# Patient Record
Sex: Female | Born: 1975 | Race: White | Hispanic: No | Marital: Married | Smoking: Former smoker
Health system: Southern US, Community
[De-identification: ages and names within clinical notes are randomized; demographics above are authoritative.]

## PROBLEM LIST (undated history)

## (undated) DIAGNOSIS — T7840XA Allergy, unspecified, initial encounter: Secondary | ICD-10-CM

## (undated) HISTORY — DX: Allergy, unspecified, initial encounter: T78.40XA

---

## 1998-07-02 ENCOUNTER — Other Ambulatory Visit: Admission: RE | Admit: 1998-07-02 | Discharge: 1998-07-02 | Payer: Self-pay | Admitting: Family Medicine

## 2004-03-20 ENCOUNTER — Other Ambulatory Visit: Admission: RE | Admit: 2004-03-20 | Discharge: 2004-03-20 | Payer: Self-pay | Admitting: Family Medicine

## 2005-03-31 ENCOUNTER — Other Ambulatory Visit: Admission: RE | Admit: 2005-03-31 | Discharge: 2005-03-31 | Payer: Self-pay | Admitting: Family Medicine

## 2006-05-24 ENCOUNTER — Encounter: Admission: RE | Admit: 2006-05-24 | Discharge: 2006-05-24 | Payer: Self-pay | Admitting: Obstetrics and Gynecology

## 2006-06-04 ENCOUNTER — Inpatient Hospital Stay (HOSPITAL_COMMUNITY): Admission: AD | Admit: 2006-06-04 | Discharge: 2006-06-07 | Payer: Self-pay | Admitting: Obstetrics and Gynecology

## 2006-07-19 ENCOUNTER — Other Ambulatory Visit: Admission: RE | Admit: 2006-07-19 | Discharge: 2006-07-19 | Payer: Self-pay | Admitting: Obstetrics and Gynecology

## 2007-04-05 ENCOUNTER — Ambulatory Visit (HOSPITAL_COMMUNITY): Admission: RE | Admit: 2007-04-05 | Discharge: 2007-04-05 | Payer: Self-pay | Admitting: Family Medicine

## 2015-06-07 ENCOUNTER — Other Ambulatory Visit (HOSPITAL_COMMUNITY): Payer: Self-pay | Admitting: Physician Assistant

## 2015-06-07 DIAGNOSIS — M5410 Radiculopathy, site unspecified: Secondary | ICD-10-CM

## 2015-06-07 DIAGNOSIS — M5126 Other intervertebral disc displacement, lumbar region: Secondary | ICD-10-CM

## 2015-06-07 DIAGNOSIS — M5136 Other intervertebral disc degeneration, lumbar region: Secondary | ICD-10-CM

## 2015-06-20 ENCOUNTER — Ambulatory Visit (HOSPITAL_COMMUNITY)
Admission: RE | Admit: 2015-06-20 | Discharge: 2015-06-20 | Disposition: A | Discharge status: Home / Self Care | Payer: No Typology Code available for payment source | Source: Ambulatory Visit | Attending: Physician Assistant | Admitting: Physician Assistant

## 2015-06-20 ENCOUNTER — Other Ambulatory Visit (HOSPITAL_COMMUNITY): Payer: Self-pay

## 2015-06-20 DIAGNOSIS — M5410 Radiculopathy, site unspecified: Secondary | ICD-10-CM

## 2015-06-20 DIAGNOSIS — M4806 Spinal stenosis, lumbar region: Secondary | ICD-10-CM | POA: Insufficient documentation

## 2015-06-20 DIAGNOSIS — M5126 Other intervertebral disc displacement, lumbar region: Secondary | ICD-10-CM | POA: Diagnosis not present

## 2015-06-20 DIAGNOSIS — M5136 Other intervertebral disc degeneration, lumbar region: Secondary | ICD-10-CM

## 2015-06-20 DIAGNOSIS — M545 Low back pain: Secondary | ICD-10-CM | POA: Diagnosis present

## 2019-05-31 HISTORY — PX: BACK SURGERY: SHX140

## 2020-01-16 ENCOUNTER — Other Ambulatory Visit: Payer: Self-pay

## 2020-01-16 ENCOUNTER — Encounter (HOSPITAL_COMMUNITY): Payer: Self-pay | Admitting: Hematology

## 2020-01-16 ENCOUNTER — Encounter (HOSPITAL_COMMUNITY): Payer: Self-pay | Admitting: *Deleted

## 2020-01-16 ENCOUNTER — Inpatient Hospital Stay (HOSPITAL_COMMUNITY): Payer: Commercial Managed Care - PPO | Attending: Hematology | Admitting: Hematology

## 2020-01-16 ENCOUNTER — Inpatient Hospital Stay (HOSPITAL_COMMUNITY): Payer: Commercial Managed Care - PPO

## 2020-01-16 VITALS — BP 130/65 | HR 87 | Temp 97.1°F | Resp 18 | Ht 62.0 in | Wt 241.0 lb

## 2020-01-16 DIAGNOSIS — D72829 Elevated white blood cell count, unspecified: Secondary | ICD-10-CM | POA: Insufficient documentation

## 2020-01-16 DIAGNOSIS — Z8616 Personal history of COVID-19: Secondary | ICD-10-CM | POA: Insufficient documentation

## 2020-01-16 DIAGNOSIS — Z87891 Personal history of nicotine dependence: Secondary | ICD-10-CM | POA: Diagnosis not present

## 2020-01-16 DIAGNOSIS — Z803 Family history of malignant neoplasm of breast: Secondary | ICD-10-CM

## 2020-01-16 LAB — CBC WITH DIFFERENTIAL/PLATELET
Abs Immature Granulocytes: 0.07 10*3/uL (ref 0.00–0.07)
Basophils Absolute: 0.1 10*3/uL (ref 0.0–0.1)
Basophils Relative: 1 %
Eosinophils Absolute: 0.1 10*3/uL (ref 0.0–0.5)
Eosinophils Relative: 1 %
HCT: 38.3 % (ref 36.0–46.0)
Hemoglobin: 12.1 g/dL (ref 12.0–15.0)
Immature Granulocytes: 1 %
Lymphocytes Relative: 19 %
Lymphs Abs: 2.8 10*3/uL (ref 0.7–4.0)
MCH: 26.9 pg (ref 26.0–34.0)
MCHC: 31.6 g/dL (ref 30.0–36.0)
MCV: 85.1 fL (ref 80.0–100.0)
Monocytes Absolute: 1 10*3/uL (ref 0.1–1.0)
Monocytes Relative: 7 %
Neutro Abs: 10.3 10*3/uL — ABNORMAL HIGH (ref 1.7–7.7)
Neutrophils Relative %: 71 %
Platelets: 336 10*3/uL (ref 150–400)
RBC: 4.5 MIL/uL (ref 3.87–5.11)
RDW: 15.9 % — ABNORMAL HIGH (ref 11.5–15.5)
WBC: 14.3 10*3/uL — ABNORMAL HIGH (ref 4.0–10.5)
nRBC: 0 % (ref 0.0–0.2)

## 2020-01-16 LAB — C-REACTIVE PROTEIN: CRP: 1.8 mg/dL — ABNORMAL HIGH (ref <1.0)

## 2020-01-16 LAB — SEDIMENTATION RATE: Sed Rate: 18 mm/hr (ref 0–22)

## 2020-01-16 LAB — LACTATE DEHYDROGENASE: LDH: 121 U/L (ref 98–192)

## 2020-01-16 NOTE — Assessment & Plan Note (Signed)
1.  Leukocytosis: -CBC on 12/29/2019 showed white count 14.9.  Differential showed increase in absolute neutrophil count and mild increase in absolute leukocyte and monocyte count. -She denied any history of infection or systemic steroids prior to that visit. -She also recollects that she was known to have elevated white count few times in the last couple of years despite having no infection.  Denies any fevers, night sweats or weight loss in the last 6 months. -Denies any personal history of connective tissue disorders.  Denies any history of splenectomy. -Reports that she was diagnosed with a minor tooth infection today by her dentist. -She reports diagnosis of Covid in November 2020 with symptoms similar to sinus infection.  She did not require hospitalization. -No family history of leukemias or connective tissue disorders. -We have reviewed the differential diagnosis of elevated white count.  We will repeat her CBC and review smear.  We will check ANA, rheumatoid factor, ESR/CRP to evaluate for connective tissue disorders.  We will also check JAK2 V617F and BCR/ABL by FISH to evaluate for myeloproliferative disorders.  We will send a flow cytometry to evaluate for lymphoproliferative disorders. -We will schedule her a telephone visit to discuss results and further work-up.

## 2020-01-16 NOTE — Patient Instructions (Addendum)
[facility] at [facility] Discharge Instructions  You were seen today by Dr. Ellin Saba. He went over your history, family history and how you've been feeling lately. He will have blood drawn today before you leave. He will follow up with you by phone visit in 3 weeks.   Thank you for choosing [facility] at [facility] to provide your oncology and hematology care.  To afford each patient quality time with our provider, please arrive at least 15 minutes before your scheduled appointment time.   If you have a lab appointment with the Cancer Center please come in thru the  Main Entrance and check in at the main information desk  You need to re-schedule your appointment should you arrive 10 or more minutes late.  We strive to give you quality time with our providers, and arriving late affects you and other patients whose appointments are after yours.  Also, if you no show three or more times for appointments you may be dismissed from the clinic at the providers discretion.     Again, thank you for choosing [facility].  Our hope is that these requests will decrease the amount of time that you wait before being seen by our physicians.       _____________________________________________________________  Should you have questions after your visit to [facility], please contact our office at [phone] between the hours of 8:00 a.m. and 4:30 p.m.  Voicemails left after 4:00 p.m. will not be returned until the following business day.  For prescription refill requests, have your pharmacy contact our office and allow 72 hours.    Cancer Center Support Programs:   > Cancer Support Group  2nd Tuesday of the month 1pm-2pm, Journey Room

## 2020-01-16 NOTE — Progress Notes (Signed)
CONSULT NOTE  Patient Care Team: Pllc, [place] as PCP - General (Family Medicine)  CHIEF COMPLAINTS/PURPOSE OF CONSULTATION:  Leukocytosis.  HISTORY OF PRESENTING ILLNESS:  Sarah Huff 44 y.o. female is seen for evaluation and work-up for leukocytosis at the request of Pablo Lawrence, FNP.  Recent CBC on 12/29/2019 showed white count of 14.9.  Hemoglobin was 12.9 and hematocrit 37.8.  Platelet count was 342.  Differential showed elevated absolute neutrophil count of 10,500 and absolute lymphocyte count mildly elevated at 3200.  Monocytes were also slightly elevated at 1000.  Eosinophils and basophils were normal.  Last systemic steroid injection was in February 2020.  She had to undergo back surgery in July 2020.  Reports no prior personal history of connective tissue disorders.  Reports decrease in the grip strength of both hands in the past few weeks.  She was reportedly started on B12 injections as her levels were low.  She was told to have a minor tooth infection by her dentist today.  Denies any fevers, night sweats or weight loss in the last 6 months.  Denies any prior history of splenectomy.  Reportedly diagnosed with COVID-19 infection in November 2020 when she had symptoms of sinus infection.  She did not require any hospitalization.  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  She was an ex-smoker, quit 14 years ago.  No family history of connective tissue disorders or leukemias.  Maternal grandmother had breast cancer.  Appetite is 100%.  Energy levels are 50%.  MEDICAL HISTORY:  Past Medical History:  Diagnosis Date  . Allergy     SURGICAL HISTORY: Past Surgical History:  Procedure Laterality Date  . BACK SURGERY  05/2019  . CESAREAN SECTION  05/2006  . TUBAL LIGATION  2007  . uterine ablation  2008  . VAGINAL DELIVERY  1995    SOCIAL HISTORY: Social History   Socioeconomic History  . Marital status: Married    Spouse name:  Montine Circle  . Number of children: 2  . Years of education: Not on file  . Highest education level: Not on file  Occupational History  . Not on file  Tobacco Use  . Smoking status: Former Smoker    Packs/day: 1.50    Years: 14.00    Pack years: 21.00    Types: Cigarettes    Quit date: 01/15/2006    Years since quitting: 14.0  . Smokeless tobacco: Never Used  Substance and Sexual Activity  . Alcohol use: Never  . Drug use: Never  . Sexual activity: Never  Other Topics Concern  . Not on file  Social History Narrative  . Not on file   Social Determinants of Health   Financial Resource Strain:   . Difficulty of Paying Living Expenses: Not on file  Food Insecurity:   . Worried About Charity fundraiser in the Last Year: Not on file  . Ran Out of Food in the Last Year: Not on file  Transportation Needs:   . Lack of Transportation (Medical): Not on file  . Lack of Transportation (Non-Medical): Not on file  Physical Activity:   . Days of Exercise per Week: Not on file  . Minutes of Exercise per Session: Not on file  Stress:   . Feeling of Stress : Not on file  Social Connections:   . Frequency of Communication with Friends and Family: Not on file  . Frequency of Social Gatherings with Friends and Family: Not  on file  . Attends Religious Services: Not on file  . Active Member of Clubs or Organizations: Not on file  . Attends Archivist Meetings: Not on file  . Marital Status: Not on file  Intimate Partner Violence:   . Fear of Current or Ex-Partner: Not on file  . Emotionally Abused: Not on file  . Physically Abused: Not on file  . Sexually Abused: Not on file    FAMILY HISTORY: Family History  Problem Relation Age of Onset  . Stroke Father   . Diabetes Father     ALLERGIES:  has no allergies on file.  MEDICATIONS:  Current Outpatient Medications  Medication Sig Dispense Refill  . cyanocobalamin (,VITAMIN B-12,) 1000 MCG/ML injection Inject 1,000 mcg into  the muscle every 30 (thirty) days.    . Cyanocobalamin (B-12) 1000 MCG CAPS Take 1,000 mcg by mouth daily.     . D3-50 1.25 MG (50000 UT) capsule Take 50,000 Units by mouth once a week.    . Multiple Vitamins-Minerals (ONE-A-DAY WOMENS) tablet Take 1 tablet by mouth daily.    . Omega-3 Fatty Acids (FISH OIL) 1000 MG CAPS Take 1,000 mg by mouth daily.    . Vitamin D, Cholecalciferol, 25 MCG (1000 UT) TABS Take 1,000 Units by mouth daily.     . ciprofloxacin (CIPRO) 500 MG tablet Take 500 mg by mouth 2 (two) times daily.      No current facility-administered medications for this visit.    REVIEW OF SYSTEMS:   Constitutional: Denies fevers, chills or abnormal night sweats.  Positive for mild fatigue. Eyes: Denies blurriness of vision, double vision or watery eyes Ears, nose, mouth, throat, and face: Denies mucositis or sore throat Respiratory: Positive for dyspnea on exertion.  Denies any cough or wheezing. Cardiovascular: Denies palpitation, chest discomfort or lower extremity swelling Gastrointestinal:  Denies nausea, heartburn or change in bowel habits Skin: Denies abnormal skin rashes Lymphatics: Denies new lymphadenopathy or easy bruising Neurological: Numbness in the left ptosis present.  No numbness in the hands.  Behavioral/Psych: Mood is stable, no new changes  All other systems were reviewed with the patient and are negative.  PHYSICAL EXAMINATION: ECOG PERFORMANCE STATUS: 0 - Asymptomatic  Vitals:   01/16/20 1307  BP: 130/65  Pulse: 87  Resp: 18  Temp: (!) 97.1 F (36.2 C)  SpO2: 98%   Filed Weights   01/16/20 1307  Weight: 241 lb (109.3 kg)    GENERAL:alert, no distress and comfortable SKIN: skin color, texture, turgor are normal, no rashes or significant lesions EYES: normal, conjunctiva are pink and non-injected, sclera clear OROPHARYNX:no exudate, no erythema and lips, buccal mucosa, and tongue normal  NECK: supple, thyroid normal size, non-tender, without  nodularity LYMPH:  no palpable lymphadenopathy in the cervical, axillary or inguinal LUNGS: clear to auscultation and percussion with normal breathing effort HEART: regular rate & rhythm and no murmurs and no lower extremity edema ABDOMEN:abdomen soft, non-tender and normal bowel sounds Musculoskeletal:no cyanosis of digits and no clubbing  PSYCH: alert & oriented x 3 with fluent speech NEURO: no focal motor/sensory deficits  LABORATORY DATA:  I have reviewed the data as listed No results found for this or any previous visit (from the past 2160 hour(s)).  RADIOGRAPHIC STUDIES: I have personally reviewed the radiological images as listed and agreed with the findings in the report.  MRI on 06/20/2015 shows L4-L5 disc protrusion.  ASSESSMENT & PLAN:  Leukocytosis 1.  Leukocytosis: -CBC on 12/29/2019 showed white count  14.9.  Differential showed increase in absolute neutrophil count and mild increase in absolute leukocyte and monocyte count. -She denied any history of infection or systemic steroids prior to that visit. -She also recollects that she was known to have elevated white count few times in the last couple of years despite having no infection.  Denies any fevers, night sweats or weight loss in the last 6 months. -Denies any personal history of connective tissue disorders.  Denies any history of splenectomy. -Reports that she was diagnosed with a minor tooth infection today by her dentist. -She reports diagnosis of Covid in November 2020 with symptoms similar to sinus infection.  She did not require hospitalization. -No family history of leukemias or connective tissue disorders. -We have reviewed the differential diagnosis of elevated white count.  We will repeat her CBC and review smear.  We will check ANA, rheumatoid factor, ESR/CRP to evaluate for connective tissue disorders.  We will also check JAK2 V617F and BCR/ABL by FISH to evaluate for myeloproliferative disorders.  We will send  a flow cytometry to evaluate for lymphoproliferative disorders. -We will schedule her a telephone visit to discuss results and further work-up.     All questions were answered. The patient knows to call the clinic with any problems, questions or concerns.      Derek Jack, MD 01/16/20 1:41 PM

## 2020-01-17 LAB — PATHOLOGIST SMEAR REVIEW

## 2020-01-17 LAB — RHEUMATOID FACTOR: Rhuematoid fact SerPl-aCnc: 13.9 IU/mL (ref 0.0–13.9)

## 2020-01-17 LAB — ANTINUCLEAR ANTIBODIES, IFA: ANA Ab, IFA: NEGATIVE

## 2020-01-17 LAB — SURGICAL PATHOLOGY

## 2020-01-24 LAB — BCR-ABL1 FISH
Cells Analyzed: 200
Cells Counted: 200

## 2020-01-26 LAB — CALR + JAK2 E12-15 + MPL (REFLEXED)

## 2020-01-26 LAB — JAK2 V617F, W REFLEX TO CALR/E12/MPL

## 2020-02-06 ENCOUNTER — Other Ambulatory Visit: Payer: Self-pay

## 2020-02-06 ENCOUNTER — Inpatient Hospital Stay (HOSPITAL_COMMUNITY): Payer: Commercial Managed Care - PPO | Attending: Hematology | Admitting: Hematology

## 2020-02-06 DIAGNOSIS — D72829 Elevated white blood cell count, unspecified: Secondary | ICD-10-CM

## 2020-02-06 NOTE — Progress Notes (Signed)
Virtual Visit via Telephone Note  I connected with Sarah Huff on 02/06/20 at  4:05 PM EST by telephone and verified that I am speaking with the correct person using two identifiers.   I discussed the limitations, risks, security and privacy concerns of performing an evaluation and management service by telephone and the availability of in person appointments. I also discussed with the patient that there may be a patient responsible charge related to this service. The patient expressed understanding and agreed to proceed.   History of Present Illness: She was seen for work-up of leukocytosis on 01/16/2020.  White count at that time was 14.9 with differential with increase in neutrophil count and mild increase in absolute lymphocyte and monocyte count.  She denied any history of recurrent infections.  No systemic steroids.  She reports that she had elevated white count since January 2020.  No personal history of connective tissue disorders.  No history of splenectomy.  She had a diagnosis of COVID-19 in November 2020 with symptoms similar to sinus infection.  She did not require hospitalization.   Observations/Objective: She was recently treated for dental infection and a tooth was extracted.  She had one episode of fever around 99 on Saturday.  No further fevers or weight loss.  Appetite is 100%.  Energy levels are 50%.  Assessment and Plan:  1.  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  Differential shows elevated absolute neutrophil count. -She is neck smoker, quit 14 years ago. -ANA and rheumatoid factor were negative.  CRP was elevated at 1.8.  ESR was normal. -Flow cytometry did not show monoclonal B-cell population or abnormal T-cell phenotype. -JAK2 V617F with reflex testing and BCR/ABL by FISH were negative. -Given no B symptoms, and normal other cell lines, I have recommended watchful waiting at this time.  If there is any significant  changes, we will consider a bone marrow aspiration and biopsy. -She was told to come back sooner should she develop any continuous fevers, night sweats or unintentional weight loss or recurrent infections.   Follow Up Instructions: RTC 4 months with labs.   I discussed the assessment and treatment plan with the patient. The patient was provided an opportunity to ask questions and all were answered. The patient agreed with the plan and demonstrated an understanding of the instructions.   The patient was advised to call back or seek an in-person evaluation if the symptoms worsen or if the condition fails to improve as anticipated.  I provided 12 minutes of non-face-to-face time during this encounter.   Derek Jack, MD

## 2020-06-12 ENCOUNTER — Inpatient Hospital Stay (HOSPITAL_COMMUNITY): Payer: Commercial Managed Care - PPO

## 2020-06-12 ENCOUNTER — Other Ambulatory Visit: Payer: Self-pay

## 2020-06-12 ENCOUNTER — Inpatient Hospital Stay (HOSPITAL_COMMUNITY): Payer: Commercial Managed Care - PPO | Attending: Hematology | Admitting: Hematology

## 2020-06-12 VITALS — BP 132/67 | HR 78 | Temp 97.7°F | Resp 18 | Wt 244.0 lb

## 2020-06-12 DIAGNOSIS — D72829 Elevated white blood cell count, unspecified: Secondary | ICD-10-CM | POA: Insufficient documentation

## 2020-06-12 DIAGNOSIS — Z833 Family history of diabetes mellitus: Secondary | ICD-10-CM | POA: Insufficient documentation

## 2020-06-12 DIAGNOSIS — Z87891 Personal history of nicotine dependence: Secondary | ICD-10-CM | POA: Diagnosis not present

## 2020-06-12 DIAGNOSIS — Z823 Family history of stroke: Secondary | ICD-10-CM | POA: Diagnosis not present

## 2020-06-12 DIAGNOSIS — Z79899 Other long term (current) drug therapy: Secondary | ICD-10-CM | POA: Insufficient documentation

## 2020-06-12 LAB — CBC WITH DIFFERENTIAL/PLATELET
Abs Immature Granulocytes: 0.06 10*3/uL (ref 0.00–0.07)
Basophils Absolute: 0.1 10*3/uL (ref 0.0–0.1)
Basophils Relative: 0 %
Eosinophils Absolute: 0.1 10*3/uL (ref 0.0–0.5)
Eosinophils Relative: 1 %
HCT: 36.7 % (ref 36.0–46.0)
Hemoglobin: 12 g/dL (ref 12.0–15.0)
Immature Granulocytes: 0 %
Lymphocytes Relative: 19 %
Lymphs Abs: 3 10*3/uL (ref 0.7–4.0)
MCH: 27.7 pg (ref 26.0–34.0)
MCHC: 32.7 g/dL (ref 30.0–36.0)
MCV: 84.8 fL (ref 80.0–100.0)
Monocytes Absolute: 1.1 10*3/uL — ABNORMAL HIGH (ref 0.1–1.0)
Monocytes Relative: 7 %
Neutro Abs: 11.7 10*3/uL — ABNORMAL HIGH (ref 1.7–7.7)
Neutrophils Relative %: 73 %
Platelets: 317 10*3/uL (ref 150–400)
RBC: 4.33 MIL/uL (ref 3.87–5.11)
RDW: 14.4 % (ref 11.5–15.5)
WBC: 16 10*3/uL — ABNORMAL HIGH (ref 4.0–10.5)
nRBC: 0 % (ref 0.0–0.2)

## 2020-06-12 LAB — C-REACTIVE PROTEIN: CRP: 1.9 mg/dL — ABNORMAL HIGH (ref <1.0)

## 2020-06-12 LAB — LACTATE DEHYDROGENASE: LDH: 119 U/L (ref 98–192)

## 2020-06-12 NOTE — Patient Instructions (Signed)
[facility] at [facility] Discharge Instructions  You were seen today by Dr. Ellin Saba. He went over your recent results. Be vigilant for any symptoms of fever, chills, drenching sweats, or unexplained weight loss; contact us as soon as they develop. Dr. Ellin Saba will see you back in 6 months for labs and follow up.   Thank you for choosing [facility] at [facility] to provide your oncology and hematology care.  To afford each patient quality time with our provider, please arrive at least 15 minutes before your scheduled appointment time.   If you have a lab appointment with the Cancer Center please come in thru the Main Entrance and check in at the main information desk  You need to re-schedule your appointment should you arrive 10 or more minutes late.  We strive to give you quality time with our providers, and arriving late affects you and other patients whose appointments are after yours.  Also, if you no show three or more times for appointments you may be dismissed from the clinic at the providers discretion.     Again, thank you for choosing [facility].  Our hope is that these requests will decrease the amount of time that you wait before being seen by our physicians.       _____________________________________________________________  Should you have questions after your visit to [facility], please contact our office at [phone] between the hours of 8:00 a.m. and 4:30 p.m.  Voicemails left after 4:00 p.m. will not be returned until the following business day.  For prescription refill requests, have your pharmacy contact our office and allow 72 hours.    Cancer Center Support Programs:   > Cancer Support Group  2nd Tuesday of the month 1pm-2pm, Journey Room

## 2020-06-12 NOTE — Progress Notes (Signed)
[place], [place] [address]   CLINIC:  Medical Oncology/Hematology  PCP:  Pllc, [facility] Sarah Huff / [place] [address]  [phone]  REASON FOR VISIT:  Follow-up for leukocytosis  PRIOR THERAPY: None  CURRENT THERAPY: Observation  INTERVAL HISTORY:  [name], a 44 y.o. female, returns for routine follow-up for her leukocytosis. Sarah Huff was last contacted via phone on 02/06/2020.  Today she reports feeling well and reports no issues since her last encounter. She denies having F/C or night sweats or recent infections.   REVIEW OF SYSTEMS:  Review of Systems  Constitutional: Negative for appetite change, chills, diaphoresis, fatigue and fever.  Cardiovascular: Negative for leg swelling.  Skin: Negative for rash.  Hematological: Negative for adenopathy.  All other systems reviewed and are negative.   PAST MEDICAL/SURGICAL HISTORY:  Past Medical History:  Diagnosis Date  . Allergy    Past Surgical History:  Procedure Laterality Date  . BACK SURGERY  05/2019  . CESAREAN SECTION  05/2006  . TUBAL LIGATION  2007  . uterine ablation  2008  . VAGINAL DELIVERY  1995    SOCIAL HISTORY:  Social History   Socioeconomic History  . Marital status: Married    Spouse name: Montine Circle  . Number of children: 2  . Years of education: Not on file  . Highest education level: Not on file  Occupational History  . Not on file  Tobacco Use  . Smoking status: Former Smoker    Packs/day: 1.50    Years: 14.00    Pack years: 21.00    Types: Cigarettes    Quit date: 01/15/2006    Years since quitting: 14.4  . Smokeless tobacco: Never Used  Substance and Sexual Activity  . Alcohol use: Never  . Drug use: Never  . Sexual activity: Never  Other Topics Concern  . Not on file  Social History Narrative  . Not on file   Social Determinants of Health   Financial Resource Strain:   . Difficulty  of Paying Living Expenses:   Food Insecurity:   . Worried About Charity fundraiser in the Last Year:   . Arboriculturist in the Last Year:   Transportation Needs:   . Film/video editor (Medical):   Marland Kitchen Lack of Transportation (Non-Medical):   Physical Activity:   . Days of Exercise per Week:   . Minutes of Exercise per Session:   Stress:   . Feeling of Stress :   Social Connections:   . Frequency of Communication with Friends and Family:   . Frequency of Social Gatherings with Friends and Family:   . Attends Religious Services:   . Active Member of Clubs or Organizations:   . Attends Archivist Meetings:   Marland Kitchen Marital Status:   Intimate Partner Violence:   . Fear of Current or Ex-Partner:   . Emotionally Abused:   Marland Kitchen Physically Abused:   . Sexually Abused:     FAMILY HISTORY:  Family History  Problem Relation Age of Onset  . Stroke Father   . Diabetes Father     CURRENT MEDICATIONS:  Current Outpatient Medications  Medication Sig Dispense Refill  . cyanocobalamin (,VITAMIN B-12,) 1000 MCG/ML injection Inject 1,000 mcg into the muscle every 30 (thirty) days.    . Cyanocobalamin (B-12) 1000 MCG CAPS Take 1,000 mcg by mouth daily.     . Multiple Vitamins-Minerals (ONE-A-DAY WOMENS)  tablet Take 1 tablet by mouth daily.    . Omega-3 Fatty Acids (FISH OIL) 1000 MG CAPS Take 1,000 mg by mouth daily.    . penicillin v potassium (VEETID) 500 MG tablet Take 500 mg by mouth 3 (three) times daily.    Marland Kitchen HYDROcodone-Acetaminophen 5-300 MG TABS Take 1 tablet by mouth every 4 (four) hours as needed. (Patient not taking: Reported on 06/12/2020)     No current facility-administered medications for this visit.    ALLERGIES:  Not on File  PHYSICAL EXAM:  Performance status (ECOG): 0 - Asymptomatic  Vitals:   06/12/20 1510  BP: 132/67  Pulse: 78  Resp: 18  Temp: 97.7 F (36.5 C)  SpO2: 97%   Wt Readings from Last 3 Encounters:  06/12/20 244 lb (110.7 kg)  01/16/20  241 lb (109.3 kg)  06/20/15 140 lb (63.5 kg)   Physical Exam Vitals reviewed.  Constitutional:      Appearance: Normal appearance. She is obese.  Cardiovascular:     Rate and Rhythm: Normal rate and regular rhythm.     Pulses: Normal pulses.     Heart sounds: Normal heart sounds.  Pulmonary:     Effort: Pulmonary effort is normal.     Breath sounds: Normal breath sounds.  Abdominal:     Palpations: Abdomen is soft. There is no hepatomegaly, splenomegaly or mass.     Tenderness: There is no abdominal tenderness.  Musculoskeletal:     Right lower leg: No edema.     Left lower leg: No edema.  Lymphadenopathy:     Cervical: No cervical adenopathy.     Upper Body:     Right upper body: No supraclavicular or axillary adenopathy.     Left upper body: No supraclavicular or axillary adenopathy.  Neurological:     General: No focal deficit present.     Mental Status: She is alert and oriented to person, place, and time.  Psychiatric:        Mood and Affect: Mood normal.        Behavior: Behavior normal.     LABORATORY DATA:  I have reviewed the labs as listed.  CBC Latest Ref Rng & Units 06/12/2020 01/16/2020  WBC 4.0 - 10.5 K/uL 16.0(H) 14.3(H)  Hemoglobin 12.0 - 15.0 g/dL 12.0 12.1  Hematocrit 36 - 46 % 36.7 38.3  Platelets 150 - 400 K/uL 317 336   No flowsheet data found.    Component Value Date/Time   RBC 4.33 06/12/2020 1442   MCV 84.8 06/12/2020 1442   MCH 27.7 06/12/2020 1442   MCHC 32.7 06/12/2020 1442   RDW 14.4 06/12/2020 1442   LYMPHSABS 3.0 06/12/2020 1442   MONOABS 1.1 (H) 06/12/2020 1442   EOSABS 0.1 06/12/2020 1442   BASOSABS 0.1 06/12/2020 1442   Lab Results  Component Value Date   LDH 119 06/12/2020   LDH 121 01/16/2020    DIAGNOSTIC IMAGING:  I have independently reviewed the scans and discussed with the patient. No results found.   ASSESSMENT:  1.  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 -Ex-smoker, quit 14 years ago. -ANA/rheumatoid factor negative. -Flow cytometry negative. -JAK2 V6 33F/BCR/ABL negative.   PLAN:  1.  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  White count is 16.  Hemoglobin and platelets are normal.  Differential shows elevated neutrophil count and slightly elevated monocytes.  CRP is elevated at 1.9.  LDH is normal. -No B symptoms.  If there is any significant change, will consider bone marrow biopsy. -We will check back in 6 months for follow-up.   Orders placed this encounter:  No orders of the defined types were placed in this encounter.    Derek Jack, MD [place] [phone]   I, Milinda Antis, am acting as a scribe for Dr. Sanda Linger.  I, Derek Jack MD, have reviewed the above documentation for accuracy and completeness, and I agree with the above.

## 2020-12-05 ENCOUNTER — Other Ambulatory Visit (HOSPITAL_COMMUNITY): Payer: Self-pay | Admitting: Internal Medicine

## 2020-12-05 DIAGNOSIS — Z1231 Encounter for screening mammogram for malignant neoplasm of breast: Secondary | ICD-10-CM

## 2020-12-13 ENCOUNTER — Other Ambulatory Visit: Payer: Self-pay

## 2020-12-13 ENCOUNTER — Ambulatory Visit (HOSPITAL_COMMUNITY)
Admission: RE | Admit: 2020-12-13 | Discharge: 2020-12-13 | Disposition: A | Discharge status: Home / Self Care | Payer: Commercial Managed Care - PPO | Source: Ambulatory Visit | Attending: Internal Medicine | Admitting: Internal Medicine

## 2020-12-13 ENCOUNTER — Ambulatory Visit (HOSPITAL_COMMUNITY): Payer: Commercial Managed Care - PPO | Admitting: Nurse Practitioner

## 2020-12-13 ENCOUNTER — Inpatient Hospital Stay (HOSPITAL_COMMUNITY): Payer: Commercial Managed Care - PPO | Attending: Hematology

## 2020-12-13 ENCOUNTER — Other Ambulatory Visit (HOSPITAL_COMMUNITY): Payer: Commercial Managed Care - PPO

## 2020-12-13 DIAGNOSIS — D72829 Elevated white blood cell count, unspecified: Secondary | ICD-10-CM | POA: Diagnosis not present

## 2020-12-13 DIAGNOSIS — Z1231 Encounter for screening mammogram for malignant neoplasm of breast: Secondary | ICD-10-CM | POA: Insufficient documentation

## 2020-12-13 LAB — LACTATE DEHYDROGENASE: LDH: 109 U/L (ref 98–192)

## 2020-12-13 LAB — CBC WITH DIFFERENTIAL/PLATELET
Abs Immature Granulocytes: 0.09 10*3/uL — ABNORMAL HIGH (ref 0.00–0.07)
Basophils Absolute: 0.1 10*3/uL (ref 0.0–0.1)
Basophils Relative: 1 %
Eosinophils Absolute: 0.1 10*3/uL (ref 0.0–0.5)
Eosinophils Relative: 1 %
HCT: 37.2 % (ref 36.0–46.0)
Hemoglobin: 12.1 g/dL (ref 12.0–15.0)
Immature Granulocytes: 1 %
Lymphocytes Relative: 15 %
Lymphs Abs: 2.6 10*3/uL (ref 0.7–4.0)
MCH: 27.8 pg (ref 26.0–34.0)
MCHC: 32.5 g/dL (ref 30.0–36.0)
MCV: 85.5 fL (ref 80.0–100.0)
Monocytes Absolute: 0.9 10*3/uL (ref 0.1–1.0)
Monocytes Relative: 5 %
Neutro Abs: 13.5 10*3/uL — ABNORMAL HIGH (ref 1.7–7.7)
Neutrophils Relative %: 77 %
Platelets: 353 10*3/uL (ref 150–400)
RBC: 4.35 MIL/uL (ref 3.87–5.11)
RDW: 15.3 % (ref 11.5–15.5)
WBC: 17.3 10*3/uL — ABNORMAL HIGH (ref 4.0–10.5)
nRBC: 0 % (ref 0.0–0.2)

## 2020-12-17 ENCOUNTER — Other Ambulatory Visit: Payer: Self-pay

## 2020-12-17 ENCOUNTER — Other Ambulatory Visit (HOSPITAL_COMMUNITY): Payer: Commercial Managed Care - PPO

## 2020-12-17 ENCOUNTER — Inpatient Hospital Stay (HOSPITAL_BASED_OUTPATIENT_CLINIC_OR_DEPARTMENT_OTHER): Payer: Commercial Managed Care - PPO | Admitting: Oncology

## 2020-12-17 DIAGNOSIS — D72829 Elevated white blood cell count, unspecified: Secondary | ICD-10-CM | POA: Diagnosis not present

## 2020-12-17 NOTE — Progress Notes (Signed)
[address], [place] [address]   CLINIC:  Medical Oncology/Hematology  PCP:  [facility] [address] / [place] [address]  [phone]  REASON FOR VISIT:  Follow-up for leukocytosis  PRIOR THERAPY: None  CURRENT THERAPY: Observation  Virtual Visit via Telephone Note  I connected with Sarah Huff on 12/17/20 at  1:50 PM EST by telephone and verified that I am speaking with the correct person using two identifiers.  Location: Patient: Home Provider: Home   I discussed the limitations, risks, security and privacy concerns of performing an evaluation and management service by telephone and the availability of in person appointments. I also discussed with the patient that there may be a patient responsible charge related to this service. The patient expressed understanding and agreed to proceed.  INTERVAL HISTORY:  Sarah Huff, a 45 y.o. female, returns for routine follow-up for her leukocytosis. Sarah Huff was last contacted via phone on 06/12/2020.  In the interim, she has done well. She denies having F/C or night sweats or recent infections.  REVIEW OF SYSTEMS:  Review of Systems  Constitutional: Negative for appetite change, chills, diaphoresis, fatigue and fever.  Cardiovascular: Negative for leg swelling.  Skin: Negative for rash.  Hematological: Negative for adenopathy.  All other systems reviewed and are negative.   PAST MEDICAL/SURGICAL HISTORY:  Past Medical History:  Diagnosis Date  . Allergy    Past Surgical History:  Procedure Laterality Date  . BACK SURGERY  05/2019  . CESAREAN SECTION  05/2006  . TUBAL LIGATION  2007  . uterine ablation  2008  . VAGINAL DELIVERY  1995    SOCIAL HISTORY:  Social History   Socioeconomic History  . Marital status: Married    Spouse name: Ladene Artist  . Number of children: 2  . Years of education: Not on file  . Highest education  level: Not on file  Occupational History  . Not on file  Tobacco Use  . Smoking status: Former Smoker    Packs/day: 1.50    Years: 14.00    Pack years: 21.00    Types: Cigarettes    Quit date: 01/15/2006    Years since quitting: 14.9  . Smokeless tobacco: Never Used  Substance and Sexual Activity  . Alcohol use: Never  . Drug use: Never  . Sexual activity: Never  Other Topics Concern  . Not on file  Social History Narrative  . Not on file   Social Determinants of Health   Financial Resource Strain: Not on file  Food Insecurity: Not on file  Transportation Needs: Not on file  Physical Activity: Not on file  Stress: Not on file  Social Connections: Not on file  Intimate Partner Violence: Not on file    FAMILY HISTORY:  Family History  Problem Relation Age of Onset  . Stroke Father   . Diabetes Father     CURRENT MEDICATIONS:  Current Outpatient Medications  Medication Sig Dispense Refill  . cyanocobalamin (,VITAMIN B-12,) 1000 MCG/ML injection Inject 1,000 mcg into the muscle every 30 (thirty) days.    . Cyanocobalamin (B-12) 1000 MCG CAPS Take 1,000 mcg by mouth daily.     Marland Kitchen HYDROcodone-Acetaminophen 5-300 MG TABS Take 1 tablet by mouth every 4 (four) hours as needed. (Patient not taking: Reported on 06/12/2020)    . Multiple Vitamins-Minerals (ONE-A-DAY WOMENS) tablet Take 1 tablet by mouth daily.    . Omega-3 Fatty Acids (FISH  OIL) 1000 MG CAPS Take 1,000 mg by mouth daily.    . penicillin v potassium (VEETID) 500 MG tablet Take 500 mg by mouth 3 (three) times daily.     No current facility-administered medications for this visit.    ALLERGIES:  Not on File  PHYSICAL EXAM:  Performance status (ECOG): 0 - Asymptomatic  There were no vitals filed for this visit. Wt Readings from Last 3 Encounters:  06/12/20 244 lb (110.7 kg)  01/16/20 241 lb (109.3 kg)  06/20/15 140 lb (63.5 kg)   Physical exam: -Limited secondary to telephone visit. -Patient speaking in  full complete sentences  LABORATORY DATA:  I have reviewed the labs as listed.  CBC Latest Ref Rng & Units 12/13/2020 06/12/2020 01/16/2020  WBC 4.0 - 10.5 K/uL 17.3(H) 16.0(H) 14.3(H)  Hemoglobin 12.0 - 15.0 g/dL 70.0 17.4 94.4  Hematocrit 36.0 - 46.0 % 37.2 36.7 38.3  Platelets 150 - 400 K/uL 353 317 336   No flowsheet data found.    Component Value Date/Time   RBC 4.35 12/13/2020 1219   MCV 85.5 12/13/2020 1219   MCH 27.8 12/13/2020 1219   MCHC 32.5 12/13/2020 1219   RDW 15.3 12/13/2020 1219   LYMPHSABS 2.6 12/13/2020 1219   MONOABS 0.9 12/13/2020 1219   EOSABS 0.1 12/13/2020 1219   BASOSABS 0.1 12/13/2020 1219   Lab Results  Component Value Date   LDH 109 12/13/2020   LDH 119 06/12/2020   LDH 121 01/16/2020    DIAGNOSTIC IMAGING:  I have independently reviewed the scans and discussed with the patient. MM 3D SCREEN BREAST BILATERAL  Result Date: 12/16/2020 CLINICAL DATA:  Screening. EXAM: DIGITAL SCREENING BILATERAL MAMMOGRAM WITH TOMO AND CAD COMPARISON:  None. ACR Breast Density Category d: The breast tissue is extremely dense, which lowers the sensitivity of mammography. FINDINGS: There are no findings suspicious for malignancy. Images were processed with CAD. IMPRESSION: No mammographic evidence of malignancy. A result letter of this screening mammogram will be mailed directly to the patient. RECOMMENDATION: Screening mammogram in one year. (Code:SM-B-01Y) BI-RADS CATEGORY  1: Negative. Electronically Signed   By: Amie Portland M.D.   On: 12/16/2020 10:01     ASSESSMENT:  1.  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 -Ex-smoker, quit 14 years ago. -ANA/rheumatoid factor negative. -Flow cytometry negative. -JAK2 V6 1F/BCR/ABL negative.   PLAN:  1.  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  White count is 17.3.  Hemoglobin and platelets  are normal.  Differential shows neutrophil count is elevated at 13.5.  LDH is normal. -She denies any B symptoms. -If any significant change, could consider doing a bone marrow. -RTC in 6 months for lab work (CBC, LDH) and MD assessment  I provided 10 minutes of non face-to-face telephone visit time during this encounter, and > 50% was spent counseling as documented under my assessment & plan.  Orders placed this encounter:  No orders of the defined types were placed in this encounter.  Durenda Hurt, NP 12/17/2020 1:52 PM  [facility] [phone]

## 2021-06-06 ENCOUNTER — Other Ambulatory Visit (HOSPITAL_COMMUNITY): Payer: Self-pay | Admitting: *Deleted

## 2021-06-06 DIAGNOSIS — D72829 Elevated white blood cell count, unspecified: Secondary | ICD-10-CM

## 2021-06-09 ENCOUNTER — Other Ambulatory Visit: Payer: Self-pay

## 2021-06-09 ENCOUNTER — Inpatient Hospital Stay (HOSPITAL_COMMUNITY): Payer: Commercial Managed Care - PPO | Attending: Hematology

## 2021-06-09 DIAGNOSIS — D72829 Elevated white blood cell count, unspecified: Secondary | ICD-10-CM | POA: Insufficient documentation

## 2021-06-09 LAB — CBC WITH DIFFERENTIAL/PLATELET
Abs Immature Granulocytes: 0.09 10*3/uL — ABNORMAL HIGH (ref 0.00–0.07)
Basophils Absolute: 0.1 10*3/uL (ref 0.0–0.1)
Basophils Relative: 0 %
Eosinophils Absolute: 0.1 10*3/uL (ref 0.0–0.5)
Eosinophils Relative: 1 %
HCT: 39.9 % (ref 36.0–46.0)
Hemoglobin: 13 g/dL (ref 12.0–15.0)
Immature Granulocytes: 1 %
Lymphocytes Relative: 16 %
Lymphs Abs: 2.8 10*3/uL (ref 0.7–4.0)
MCH: 28.2 pg (ref 26.0–34.0)
MCHC: 32.6 g/dL (ref 30.0–36.0)
MCV: 86.6 fL (ref 80.0–100.0)
Monocytes Absolute: 1 10*3/uL (ref 0.1–1.0)
Monocytes Relative: 5 %
Neutro Abs: 13.7 10*3/uL — ABNORMAL HIGH (ref 1.7–7.7)
Neutrophils Relative %: 77 %
Platelets: 308 10*3/uL (ref 150–400)
RBC: 4.61 MIL/uL (ref 3.87–5.11)
RDW: 15.1 % (ref 11.5–15.5)
WBC: 17.7 10*3/uL — ABNORMAL HIGH (ref 4.0–10.5)
nRBC: 0 % (ref 0.0–0.2)

## 2021-06-09 LAB — LACTATE DEHYDROGENASE: LDH: 113 U/L (ref 98–192)

## 2021-06-11 NOTE — Progress Notes (Signed)
Sarah Huff, [name] [address]   CLINIC:  Medical Oncology/Hematology  PCP:  Celene Squibb, MD [name] [address] [phone]   REASON FOR VISIT:  Follow-up for leukocytosis  PRIOR THERAPY: None  CURRENT THERAPY: Observation  INTERVAL HISTORY:  [name] 45 y.o. female returns for routine follow-up of leukocytosis.  She was last evaluated by NP Faythe Casa via telemedicine visit on 12/17/2020.  At today's visit, she reports feeling fair.  Her father passed away 2 months ago, and she has been grieving his death, and experiencing some increased anxiety and depression.  She denies any thoughts of self-harm.  No recent hospitalizations, surgeries, or changes in baseline health status.  She denies having any fever, chills, night sweats, unintentional weight loss.  She had an infection in her eye 2 weeks ago, received antibiotic drops.  No other recent infections or steroid use.     No fatigue, shortness of breath, cough, chest pain, nausea, vomiting, abdominal pain.  Denies any signs or symptoms of blood loss.  No current signs or symptoms of blood clots.  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  She has 80% energy and 100% appetite. She endorses that she is maintaining a stable weight.    REVIEW OF SYSTEMS:  Review of Systems  Constitutional:  Negative for appetite change, chills, diaphoresis, fatigue, fever and unexpected weight change.  HENT:   Negative for lump/mass and nosebleeds.   Eyes:  Negative for eye problems.  Respiratory:  Negative for cough, hemoptysis and shortness of breath.   Cardiovascular:  Negative for chest pain, leg swelling and palpitations.  Gastrointestinal:  Negative for abdominal pain, blood in stool, constipation, diarrhea, nausea and vomiting.  Genitourinary:  Negative for hematuria.   Skin: Negative.   Neurological:   Positive for headaches and numbness. Negative for dizziness and light-headedness.  Hematological:  Does not bruise/bleed easily.  Psychiatric/Behavioral:  Positive for depression. The patient is nervous/anxious.      PAST MEDICAL/SURGICAL HISTORY:  Past Medical History:  Diagnosis Date   Allergy    Past Surgical History:  Procedure Laterality Date   BACK SURGERY  05/2019   CESAREAN SECTION  05/2006   TUBAL LIGATION  2007   uterine ablation  2008   VAGINAL DELIVERY  1995     SOCIAL HISTORY:  Social History   Socioeconomic History   Marital status: Married    Spouse name: Derrick   Number of children: 2   Years of education: Not on file   Highest education level: Not on file  Occupational History   Not on file  Tobacco Use   Smoking status: Former    Packs/day: 1.50    Years: 14.00    Pack years: 21.00    Types: Cigarettes    Quit date: 01/15/2006    Years since quitting: 15.4   Smokeless tobacco: Never  Substance and Sexual Activity   Alcohol use: Never   Drug use: Never   Sexual activity: Never  Other Topics Concern   Not on file  Social History Narrative   Not on file   Social Determinants of Health   Financial Resource Strain: Not on file  Food Insecurity: Not on file  Transportation Needs: Not on file  Physical Activity: Not on file  Stress: Not on file  Social Connections: Not on file  Intimate Partner Violence: Not on file  FAMILY HISTORY:  Family History  Problem Relation Age of Onset   Stroke Father    Diabetes Father     CURRENT MEDICATIONS:  Outpatient Encounter Medications as of 06/12/2021  Medication Sig   cyanocobalamin (,VITAMIN B-12,) 1000 MCG/ML injection Inject 1,000 mcg into the muscle every 30 (thirty) days.   Cyanocobalamin (B-12) 1000 MCG CAPS Take 1,000 mcg by mouth daily.    HYDROcodone-Acetaminophen 5-300 MG TABS Take 1 tablet by mouth every 4 (four) hours as needed. (Patient not taking: Reported on 06/12/2020)   Multiple  Vitamins-Minerals (ONE-A-DAY WOMENS) tablet Take 1 tablet by mouth daily.   Omega-3 Fatty Acids (FISH OIL) 1000 MG CAPS Take 1,000 mg by mouth daily.   penicillin v potassium (VEETID) 500 MG tablet Take 500 mg by mouth 3 (three) times daily.   No facility-administered encounter medications on file as of 06/12/2021.    ALLERGIES:  Not on File   PHYSICAL EXAM:  ECOG PERFORMANCE STATUS: 0 - Asymptomatic  There were no vitals filed for this visit. There were no vitals filed for this visit. Physical Exam Constitutional:      Appearance: Normal appearance. She is obese.  HENT:     Head: Normocephalic and atraumatic.     Mouth/Throat:     Mouth: Mucous membranes are moist.  Eyes:     Extraocular Movements: Extraocular movements intact.     Pupils: Pupils are equal, round, and reactive to light.  Cardiovascular:     Rate and Rhythm: Normal rate and regular rhythm.     Pulses: Normal pulses.     Heart sounds: Normal heart sounds.  Pulmonary:     Effort: Pulmonary effort is normal.     Breath sounds: Normal breath sounds.  Abdominal:     General: Bowel sounds are normal.     Palpations: Abdomen is soft.     Tenderness: There is no abdominal tenderness.  Musculoskeletal:        General: No swelling.     Right lower leg: No edema.     Left lower leg: No edema.  Lymphadenopathy:     Cervical: No cervical adenopathy.  Skin:    General: Skin is warm and dry.  Neurological:     General: No focal deficit present.     Mental Status: She is alert and oriented to person, place, and time.  Psychiatric:        Mood and Affect: Mood normal.        Behavior: Behavior normal.     LABORATORY DATA:  I have reviewed the labs as listed.  CBC    Component Value Date/Time   WBC 17.7 (H) 06/09/2021 1408   RBC 4.61 06/09/2021 1408   HGB 13.0 06/09/2021 1408   HCT 39.9 06/09/2021 1408   PLT 308 06/09/2021 1408   MCV 86.6 06/09/2021 1408   MCH 28.2 06/09/2021 1408   MCHC 32.6 06/09/2021  1408   RDW 15.1 06/09/2021 1408   LYMPHSABS 2.8 06/09/2021 1408   MONOABS 1.0 06/09/2021 1408   EOSABS 0.1 06/09/2021 1408   BASOSABS 0.1 06/09/2021 1408   No flowsheet data found.  DIAGNOSTIC IMAGING:  I have independently reviewed the relevant imaging and discussed with the patient.  ASSESSMENT & PLAN: 1.  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 - Ex-smoker, quit 14 years ago. - ANA/rheumatoid factor negative.  No known history of connective tissue disorder  or rheumatologic diseases.  Denies recent steroid use or infections. - JAK2/CALR/MPL and BCR/ABL testing were negative..  Flow cytometry was negative. - Most recent labs (06/09/2021): WBC 17.7 with ANC 13.7, normal Hgb and platelets.  LDH is normal. - No B symptoms or vasomotor symptoms - PLAN: Repeat labs and follow-up in 6 months.  If any significant change, consider bone marrow biopsy.   PLAN SUMMARY & DISPOSITION: -Labs and RTC in 6 months  All questions were answered. The patient knows to call the clinic with any problems, questions or concerns.  Medical decision making: Low  Time spent on visit: I spent 15 minutes counseling the patient face to face. The total time spent in the appointment was 25 minutes and more than 50% was on counseling.   Harriett Rush, PA-C  06/12/2021 11:05 AM

## 2021-06-12 ENCOUNTER — Inpatient Hospital Stay (HOSPITAL_BASED_OUTPATIENT_CLINIC_OR_DEPARTMENT_OTHER): Payer: Commercial Managed Care - PPO | Admitting: Physician Assistant

## 2021-06-12 ENCOUNTER — Other Ambulatory Visit: Payer: Self-pay

## 2021-06-12 VITALS — BP 160/88 | HR 69 | Temp 97.0°F | Resp 18 | Wt 235.5 lb

## 2021-06-12 DIAGNOSIS — D72829 Elevated white blood cell count, unspecified: Secondary | ICD-10-CM | POA: Diagnosis not present

## 2021-06-12 NOTE — Patient Instructions (Signed)
[place] at [facility] Discharge Instructions  You were seen today by Tarri Abernethy PA-C for your leukocytosis  (elevated white blood cell count).  Your white blood cell count continues to remain elevated, but is essentially stable at your previous baseline range.  You do not require any further testing or treatment at this time, but we would like to continue to follow you and check your lab counts again in 6 months.  If you have any significant increase in your white blood cells or other types of blood cells, we will consider bone marrow biopsy.  You should also let us know if you experience any "alarm symptoms" such as unexplained fever/chills, night sweats, or unintentional weight loss.  LABS: Return in 6 months for repeat labs  OTHER TESTS: None at this time  MEDICATIONS: No changes to home medications  FOLLOW-UP APPOINTMENT: Office visit in 6 months   Thank you for choosing [place] at [facility] to provide your oncology and hematology care.  To afford each patient quality time with our provider, please arrive at least 15 minutes before your scheduled appointment time.   If you have a lab appointment with the [facility] please come in thru the Main Entrance and check in at the main information desk.  You need to re-schedule your appointment should you arrive 10 or more minutes late.  We strive to give you quality time with our providers, and arriving late affects you and other patients whose appointments are after yours.  Also, if you no show three or more times for appointments you may be dismissed from the clinic at the providers discretion.     Again, thank you for choosing [facility].  Our hope is that these requests will decrease the amount of time that you wait before being seen by our physicians.       _____________________________________________________________  Should you have questions after your visit to  [facility], please contact our office at [phone] and follow the prompts.  Our office hours are 8:00 a.m. and 4:30 p.m. Monday - Friday.  Please note that voicemails left after 4:00 p.m. may not be returned until the following business day.  We are closed weekends and major holidays.  You do have access to a nurse 24-7, just call the main number to the clinic [phone] and do not press any options, hold on the line and a nurse will answer the phone.    For prescription refill requests, have your pharmacy contact our office and allow 72 hours.    Due to Covid, you will need to wear a mask upon entering the hospital. If you do not have a mask, a mask will be given to you at the Main Entrance upon arrival. For doctor visits, patients may have 1 support person age 63 or older with them. For treatment visits, patients can not have anyone with them due to social distancing guidelines and our immunocompromised population.

## 2021-06-16 ENCOUNTER — Ambulatory Visit (HOSPITAL_COMMUNITY): Payer: Commercial Managed Care - PPO | Admitting: Hematology

## 2021-12-09 ENCOUNTER — Other Ambulatory Visit (HOSPITAL_COMMUNITY): Payer: Self-pay | Admitting: Internal Medicine

## 2021-12-09 DIAGNOSIS — Z1231 Encounter for screening mammogram for malignant neoplasm of breast: Secondary | ICD-10-CM

## 2021-12-12 ENCOUNTER — Other Ambulatory Visit: Payer: Self-pay

## 2021-12-12 ENCOUNTER — Inpatient Hospital Stay (HOSPITAL_COMMUNITY): Payer: Commercial Managed Care - PPO | Attending: Hematology

## 2021-12-12 DIAGNOSIS — D72829 Elevated white blood cell count, unspecified: Secondary | ICD-10-CM | POA: Insufficient documentation

## 2021-12-12 LAB — CBC WITH DIFFERENTIAL/PLATELET
Abs Immature Granulocytes: 0.03 10*3/uL (ref 0.00–0.07)
Basophils Absolute: 0.1 10*3/uL (ref 0.0–0.1)
Basophils Relative: 1 %
Eosinophils Absolute: 0.1 10*3/uL (ref 0.0–0.5)
Eosinophils Relative: 1 %
HCT: 36.8 % (ref 36.0–46.0)
Hemoglobin: 12 g/dL (ref 12.0–15.0)
Immature Granulocytes: 0 %
Lymphocytes Relative: 16 %
Lymphs Abs: 1.6 10*3/uL (ref 0.7–4.0)
MCH: 28.8 pg (ref 26.0–34.0)
MCHC: 32.6 g/dL (ref 30.0–36.0)
MCV: 88.5 fL (ref 80.0–100.0)
Monocytes Absolute: 0.7 10*3/uL (ref 0.1–1.0)
Monocytes Relative: 7 %
Neutro Abs: 7.8 10*3/uL — ABNORMAL HIGH (ref 1.7–7.7)
Neutrophils Relative %: 75 %
Platelets: 299 10*3/uL (ref 150–400)
RBC: 4.16 MIL/uL (ref 3.87–5.11)
RDW: 14.6 % (ref 11.5–15.5)
WBC: 10.2 10*3/uL (ref 4.0–10.5)
nRBC: 0 % (ref 0.0–0.2)

## 2021-12-12 LAB — LACTATE DEHYDROGENASE: LDH: 95 U/L — ABNORMAL LOW (ref 98–192)

## 2021-12-18 NOTE — Progress Notes (Signed)
[place], Sarah Huff [id number]   CLINIC:  Medical Oncology/Hematology  PCP:  Celene Squibb, MD [place] [address] [phone]   REASON FOR VISIT:  Follow-up for leukocytosis  PRIOR THERAPY: None  CURRENT THERAPY: Observation  INTERVAL HISTORY:  Sarah Huff 46 y.o. female returns for routine follow-up of leukocytosis.  She was last seen by Tarri Abernethy PA-C on 06/12/2021.  At today's visit, she reports feeling well.  No recent hospitalizations, surgeries, or changes in baseline health status.  She denies having any frequent infections.  She had a steroid shot in October 2022.  She does not smoke or vape.  She has not noticed any new lumps or bumps.  She denies any B symptoms such as fever, chills, night sweats, unintentional weight loss.  She has 100% energy and 100% appetite. She has lost 40 pounds since her last visit with intermittent fasting.   REVIEW OF SYSTEMS:  Review of Systems  Constitutional:  Negative for appetite change, chills, diaphoresis, fatigue, fever and unexpected weight change.  HENT:   Negative for lump/mass and nosebleeds.   Eyes:  Negative for eye problems.  Respiratory:  Negative for cough, hemoptysis and shortness of breath.   Cardiovascular:  Negative for chest pain, leg swelling and palpitations.  Gastrointestinal:  Negative for abdominal pain, blood in stool, constipation, diarrhea, nausea and vomiting.  Genitourinary:  Negative for hematuria.   Musculoskeletal:  Positive for arthralgias and neck pain.  Skin: Negative.   Neurological:  Positive for numbness (left shoulder (cervical radulopathy)). Negative for dizziness, headaches and light-headedness.  Hematological:  Does not bruise/bleed easily.     PAST MEDICAL/SURGICAL HISTORY:  Past Medical History:  Diagnosis Date   Allergy    Past Surgical History:  Procedure Laterality Date   BACK SURGERY  05/2019   CESAREAN SECTION   05/2006   TUBAL LIGATION  2007   uterine ablation  2008   VAGINAL DELIVERY  1995     SOCIAL HISTORY:  Social History   Socioeconomic History   Marital status: Married    Spouse name: Sarah Huff   Number of children: 2   Years of education: Not on file   Highest education level: Not on file  Occupational History   Not on file  Tobacco Use   Smoking status: Former    Packs/day: 1.50    Years: 14.00    Pack years: 21.00    Types: Cigarettes    Quit date: 01/15/2006    Years since quitting: 15.9   Smokeless tobacco: Never  Substance and Sexual Activity   Alcohol use: Never   Drug use: Never   Sexual activity: Never  Other Topics Concern   Not on file  Social History Narrative   Not on file   Social Determinants of Health   Financial Resource Strain: Not on file  Food Insecurity: Not on file  Transportation Needs: Not on file  Physical Activity: Not on file  Stress: Not on file  Social Connections: Not on file  Intimate Partner Violence: Not on file    FAMILY HISTORY:  Family History  Problem Relation Age of Onset   Stroke Father    Diabetes Father     CURRENT MEDICATIONS:  No outpatient encounter medications on file as of 12/19/2021.   No facility-administered encounter medications on file as of 12/19/2021.    ALLERGIES:  Not on File   PHYSICAL EXAM:  ECOG PERFORMANCE STATUS: 0 -  Asymptomatic  There were no vitals filed for this visit. There were no vitals filed for this visit. Physical Exam Constitutional:      Appearance: Normal appearance. She is obese.  HENT:     Head: Normocephalic and atraumatic.     Mouth/Throat:     Mouth: Mucous membranes are moist.  Eyes:     Extraocular Movements: Extraocular movements intact.     Pupils: Pupils are equal, round, and reactive to light.  Cardiovascular:     Rate and Rhythm: Normal rate and regular rhythm.     Pulses: Normal pulses.     Heart sounds: Normal heart sounds.  Pulmonary:     Effort:  Pulmonary effort is normal.     Breath sounds: Normal breath sounds.  Abdominal:     General: Bowel sounds are normal.     Palpations: Abdomen is soft.     Tenderness: There is no abdominal tenderness.  Musculoskeletal:        General: No swelling.     Right lower leg: No edema.     Left lower leg: No edema.  Lymphadenopathy:     Cervical: No cervical adenopathy.  Skin:    General: Skin is warm and dry.  Neurological:     General: No focal deficit present.     Mental Status: She is alert and oriented to person, place, and time.  Psychiatric:        Mood and Affect: Mood normal.        Behavior: Behavior normal.     LABORATORY DATA:  I have reviewed the labs as listed.  CBC    Component Value Date/Time   WBC 10.2 12/12/2021 0833   RBC 4.16 12/12/2021 0833   HGB 12.0 12/12/2021 0833   HCT 36.8 12/12/2021 0833   PLT 299 12/12/2021 0833   MCV 88.5 12/12/2021 0833   MCH 28.8 12/12/2021 0833   MCHC 32.6 12/12/2021 0833   RDW 14.6 12/12/2021 0833   LYMPHSABS 1.6 12/12/2021 0833   MONOABS 0.7 12/12/2021 0833   EOSABS 0.1 12/12/2021 0833   BASOSABS 0.1 12/12/2021 0833   No flowsheet data found.  DIAGNOSTIC IMAGING:  I have independently reviewed the relevant imaging and discussed with the patient.  ASSESSMENT & PLAN: 1.  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 - JAK2/CALR/MPL and BCR/ABL testing were negative..  Flow cytometry was negative. - Ex-smoker, quit 14 years ago. - ANA/rheumatoid factor negative.  No known history of connective tissue disorder or rheumatologic diseases.  She had a steroid injection in October 2022.  No recent infections.  - No B symptoms or vasomotor symptoms  - No palpable lymphadenopathy or hepatosplenomegaly on exam  - Most recent labs (12/12/2021): Normal WBC 10.2 with mildly elevated ANC 7.8.  LDH  normal. - Intermittent self-resolving leukocytosis favors diagnosis of reactive leukocytosis, possibly in the setting of obesity.  WBC's seem to be improving as she is losing weight with intermittent fasting. - PLAN: Repeat labs only in 6 months.  Labs and RTC in 12 months.  If counts remain normal, we will likely discharge from clinic in 1 year. - No indication for bone marrow biopsy at this time, but we will consider this if she has any significant changes in her blood counts.   PLAN SUMMARY & DISPOSITION: Labs only in 6 months Labs and follow-up visit in 1 year  All questions were answered.  The patient knows to call the clinic with any problems, questions or concerns.  Medical decision making: Low  Time spent on visit: I spent 11 minutes counseling the patient face to face. The total time spent in the appointment was 15 minutes and more than 50% was on counseling.   Harriett Rush, PA-C  12/19/2021 8:53 AM

## 2021-12-19 ENCOUNTER — Ambulatory Visit (HOSPITAL_COMMUNITY)
Admission: RE | Admit: 2021-12-19 | Discharge: 2021-12-19 | Disposition: A | Discharge status: Home / Self Care | Payer: Commercial Managed Care - PPO | Source: Ambulatory Visit | Attending: Internal Medicine | Admitting: Internal Medicine

## 2021-12-19 ENCOUNTER — Inpatient Hospital Stay (HOSPITAL_BASED_OUTPATIENT_CLINIC_OR_DEPARTMENT_OTHER): Payer: Commercial Managed Care - PPO | Admitting: Physician Assistant

## 2021-12-19 ENCOUNTER — Other Ambulatory Visit: Payer: Self-pay

## 2021-12-19 VITALS — BP 130/67 | HR 64 | Temp 98.0°F | Resp 18 | Ht 62.0 in | Wt 196.4 lb

## 2021-12-19 DIAGNOSIS — D72829 Elevated white blood cell count, unspecified: Secondary | ICD-10-CM

## 2021-12-19 DIAGNOSIS — Z1231 Encounter for screening mammogram for malignant neoplasm of breast: Secondary | ICD-10-CM | POA: Diagnosis present

## 2021-12-19 NOTE — Patient Instructions (Signed)
[place] at [facility] Discharge Instructions  You were seen today by Tarri Abernethy PA-C for your elevated white blood cells.      LABS: Return in 6 months and 12 months for repeat labs   OTHER TESTS: None  MEDICATIONS: No changes  FOLLOW-UP APPOINTMENT: Office visit in 12 months   Thank you for choosing [place] at [facility] to provide your oncology and hematology care.  To afford each patient quality time with our provider, please arrive at least 15 minutes before your scheduled appointment time.   If you have a lab appointment with the [place] please come in thru the Main Entrance and check in at the main information desk.  You need to re-schedule your appointment should you arrive 10 or more minutes late.  We strive to give you quality time with our providers, and arriving late affects you and other patients whose appointments are after yours.  Also, if you no show three or more times for appointments you may be dismissed from the clinic at the providers discretion.     Again, thank you for choosing [facility].  Our hope is that these requests will decrease the amount of time that you wait before being seen by our physicians.       _____________________________________________________________  Should you have questions after your visit to [facility], please contact our office at [phone] and follow the prompts.  Our office hours are 8:00 a.m. and 4:30 p.m. Monday - Friday.  Please note that voicemails left after 4:00 p.m. may not be returned until the following business day.  We are closed weekends and major holidays.  You do have access to a nurse 24-7, just call the main number to the clinic [phone] and do not press any options, hold on the line and a nurse will answer the phone.    For prescription refill requests, have your pharmacy contact our office and allow 72 hours.    Due to  Covid, you will need to wear a mask upon entering the hospital. If you do not have a mask, a mask will be given to you at the Main Entrance upon arrival. For doctor visits, patients may have 1 support person age 65 or older with them. For treatment visits, patients can not have anyone with them due to social distancing guidelines and our immunocompromised population.

## 2022-06-17 ENCOUNTER — Inpatient Hospital Stay (HOSPITAL_COMMUNITY): Payer: Commercial Managed Care - PPO | Attending: Hematology

## 2022-06-17 DIAGNOSIS — D72829 Elevated white blood cell count, unspecified: Secondary | ICD-10-CM | POA: Diagnosis present

## 2022-06-17 LAB — CBC WITH DIFFERENTIAL/PLATELET
Abs Immature Granulocytes: 0.04 10*3/uL (ref 0.00–0.07)
Basophils Absolute: 0.1 10*3/uL (ref 0.0–0.1)
Basophils Relative: 1 %
Eosinophils Absolute: 0.1 10*3/uL (ref 0.0–0.5)
Eosinophils Relative: 1 %
HCT: 37.7 % (ref 36.0–46.0)
Hemoglobin: 12.2 g/dL (ref 12.0–15.0)
Immature Granulocytes: 0 %
Lymphocytes Relative: 19 %
Lymphs Abs: 2 10*3/uL (ref 0.7–4.0)
MCH: 27.7 pg (ref 26.0–34.0)
MCHC: 32.4 g/dL (ref 30.0–36.0)
MCV: 85.7 fL (ref 80.0–100.0)
Monocytes Absolute: 0.7 10*3/uL (ref 0.1–1.0)
Monocytes Relative: 7 %
Neutro Abs: 7.5 10*3/uL (ref 1.7–7.7)
Neutrophils Relative %: 72 %
Platelets: 275 10*3/uL (ref 150–400)
RBC: 4.4 MIL/uL (ref 3.87–5.11)
RDW: 14.9 % (ref 11.5–15.5)
WBC: 10.3 10*3/uL (ref 4.0–10.5)
nRBC: 0 % (ref 0.0–0.2)

## 2022-06-17 LAB — LACTATE DEHYDROGENASE: LDH: 99 U/L (ref 98–192)

## 2022-09-29 ENCOUNTER — Encounter (HOSPITAL_COMMUNITY): Payer: Self-pay | Admitting: Emergency Medicine

## 2022-09-29 ENCOUNTER — Emergency Department (HOSPITAL_COMMUNITY)
Admission: EM | Admit: 2022-09-29 | Discharge: 2022-09-29 | Disposition: A | Discharge status: Home / Self Care | Payer: Commercial Managed Care - PPO | Attending: Emergency Medicine | Admitting: Emergency Medicine

## 2022-09-29 ENCOUNTER — Emergency Department (HOSPITAL_COMMUNITY): Payer: Commercial Managed Care - PPO

## 2022-09-29 DIAGNOSIS — R079 Chest pain, unspecified: Secondary | ICD-10-CM | POA: Diagnosis not present

## 2022-09-29 DIAGNOSIS — R110 Nausea: Secondary | ICD-10-CM | POA: Insufficient documentation

## 2022-09-29 DIAGNOSIS — R1013 Epigastric pain: Secondary | ICD-10-CM | POA: Insufficient documentation

## 2022-09-29 DIAGNOSIS — R1010 Upper abdominal pain, unspecified: Secondary | ICD-10-CM

## 2022-09-29 LAB — CBC
HCT: 34.4 % — ABNORMAL LOW (ref 36.0–46.0)
Hemoglobin: 11.3 g/dL — ABNORMAL LOW (ref 12.0–15.0)
MCH: 27.8 pg (ref 26.0–34.0)
MCHC: 32.8 g/dL (ref 30.0–36.0)
MCV: 84.5 fL (ref 80.0–100.0)
Platelets: 321 10*3/uL (ref 150–400)
RBC: 4.07 MIL/uL (ref 3.87–5.11)
RDW: 14.4 % (ref 11.5–15.5)
WBC: 13.2 10*3/uL — ABNORMAL HIGH (ref 4.0–10.5)
nRBC: 0 % (ref 0.0–0.2)

## 2022-09-29 LAB — BASIC METABOLIC PANEL
Anion gap: 7 (ref 5–15)
BUN: 18 mg/dL (ref 6–20)
CO2: 22 mmol/L (ref 22–32)
Calcium: 8.8 mg/dL — ABNORMAL LOW (ref 8.9–10.3)
Chloride: 108 mmol/L (ref 98–111)
Creatinine, Ser: 0.72 mg/dL (ref 0.44–1.00)
GFR, Estimated: >60 mL/min (ref >60)
Glucose, Bld: 109 mg/dL — ABNORMAL HIGH (ref 70–99)
Potassium: 3.5 mmol/L (ref 3.5–5.1)
Sodium: 137 mmol/L (ref 135–145)

## 2022-09-29 LAB — HEPATIC FUNCTION PANEL
ALT: 24 U/L (ref 0–44)
AST: 50 U/L — ABNORMAL HIGH (ref 15–41)
Albumin: 3.9 g/dL (ref 3.5–5.0)
Alkaline Phosphatase: 69 U/L (ref 38–126)
Bilirubin, Direct: <0.1 mg/dL (ref 0.0–0.2)
Total Bilirubin: 0.4 mg/dL (ref 0.3–1.2)
Total Protein: 7.3 g/dL (ref 6.5–8.1)

## 2022-09-29 LAB — LIPASE, BLOOD: Lipase: 30 U/L (ref 11–51)

## 2022-09-29 LAB — TROPONIN I (HIGH SENSITIVITY)
Troponin I (High Sensitivity): <2 ng/L (ref <18)
Troponin I (High Sensitivity): 2 ng/L (ref <18)

## 2022-09-29 MED ORDER — IOHEXOL 300 MG/ML  SOLN
100.0000 mL | Freq: Once | INTRAMUSCULAR | Status: AC | PRN
  Administered 2022-09-29: 75 mL via INTRAVENOUS

## 2022-09-29 MED ORDER — ONDANSETRON HCL 4 MG/2ML IJ SOLN
4.0000 mg | Freq: Once | INTRAMUSCULAR | Status: AC
  Administered 2022-09-29: 4 mg via INTRAVENOUS
  Filled 2022-09-29: qty 2

## 2022-09-29 MED ORDER — PANTOPRAZOLE SODIUM 40 MG IV SOLR
40.0000 mg | Freq: Once | INTRAVENOUS | Status: AC
  Administered 2022-09-29: 40 mg via INTRAVENOUS
  Filled 2022-09-29: qty 10

## 2022-09-29 MED ORDER — OXYCODONE-ACETAMINOPHEN 5-325 MG PO TABS: 1.0000 | ORAL_TABLET | Freq: Four times a day (QID) | ORAL | 0 refills | Status: DC | PRN

## 2022-09-29 MED ORDER — SODIUM CHLORIDE 0.9 % IV BOLUS
1000.0000 mL | Freq: Once | INTRAVENOUS | Status: AC
  Administered 2022-09-29: 1000 mL via INTRAVENOUS

## 2022-09-29 MED ORDER — ONDANSETRON 4 MG PO TBDP: ORAL_TABLET | ORAL | 0 refills | Status: DC

## 2022-09-29 MED ORDER — HYDROMORPHONE HCL 1 MG/ML IJ SOLN
1.0000 mg | Freq: Once | INTRAMUSCULAR | Status: AC
  Administered 2022-09-29: 1 mg via INTRAVENOUS
  Filled 2022-09-29: qty 1

## 2022-09-29 NOTE — Discharge Instructions (Signed)
Follow-up with Dr. Constance Haw or one of her colleagues in the next week.  Return if any problems

## 2022-09-29 NOTE — ED Provider Notes (Incomplete)
[facility] EMERGENCY DEPARTMENT Provider Note   CSN: [id number] Arrival date & time: 09/29/22  1921     History {Add pertinent medical, surgical, social history, OB history to HPI:1} Chief Complaint  Patient presents with   Chest Pain    Sarah Huff is a 46 y.o. female.  Patient has a history of obesity.  Patient presents with severe epigastric pain radiating to her back some nausea no vomiting   Abdominal Pain      Home Medications Prior to Admission medications   Medication Sig Start Date End Date Taking? Authorizing Provider  Cholecalciferol (VITAMIN D3) 1.25 MG (50000 UT) CAPS Take 1 capsule by mouth once a week. 10/14/21  Yes [provider]  ibuprofen (ADVIL) 200 MG tablet Take 400 mg by mouth every 6 (six) hours as needed for moderate pain.   Yes [provider]  ondansetron (ZOFRAN-ODT) 4 MG disintegrating tablet 4mg  ODT q4 hours prn nausea/vomit 09/29/22  Yes Milton Ferguson, MD  oxyCODONE-acetaminophen (PERCOCET/ROXICET) 5-325 MG tablet Take 1 tablet by mouth every 6 (six) hours as needed for severe pain. 09/29/22  Yes Milton Ferguson, MD  allopurinol (ZYLOPRIM) 100 MG tablet  11/10/21   [provider]  oxyCODONE-acetaminophen (PERCOCET/ROXICET) 5-325 MG tablet Take 1 tablet by mouth every 6 (six) hours as needed for severe pain. 09/29/22   Milton Ferguson, MD      Allergies    Patient has no known allergies.    Review of Systems   Review of Systems  Gastrointestinal:  Positive for abdominal pain.    Physical Exam Updated Vital Signs BP 135/74   Pulse 66   Temp 97.9 F (36.6 C) (Axillary)   Resp (!) 23   Ht 5\' 2"  (1.575 m)   Wt 89.1 kg   SpO2 94%   BMI 35.93 kg/m  Physical Exam  ED Results / Procedures / Treatments   Labs (all labs ordered are listed, but only abnormal results are displayed) Labs Reviewed  BASIC METABOLIC PANEL - Abnormal; Notable for the following components:      Result Value   Glucose, Bld  109 (*)    Calcium 8.8 (*)    All other components within normal limits  CBC - Abnormal; Notable for the following components:   WBC 13.2 (*)    Hemoglobin 11.3 (*)    HCT 34.4 (*)    All other components within normal limits  HEPATIC FUNCTION PANEL - Abnormal; Notable for the following components:   AST 50 (*)    All other components within normal limits  LIPASE, BLOOD  TROPONIN I (HIGH SENSITIVITY)  TROPONIN I (HIGH SENSITIVITY)    EKG None  Radiology CT ABDOMEN PELVIS W CONTRAST  Result Date: 09/29/2022 CLINICAL DATA:  Abdominal pain, acute, nonlocalized. Epigastric abdominal pain. EXAM: CT ABDOMEN AND PELVIS WITH CONTRAST TECHNIQUE: Multidetector CT imaging of the abdomen and pelvis was performed using the standard protocol following bolus administration of intravenous contrast. RADIATION DOSE REDUCTION: This exam was performed according to the departmental dose-optimization program which includes automated exposure control, adjustment of the mA and/or kV according to patient size and/or use of iterative reconstruction technique. CONTRAST:  68mL OMNIPAQUE IOHEXOL 300 MG/ML  SOLN COMPARISON:  None Available. FINDINGS: Lower chest: No acute abnormality. Hepatobiliary: There is moderate periportal edema present. No intra or extrahepatic biliary ductal dilation. No enhancing intrahepatic mass. The portal vein is patent. Cholelithiasis without pericholecystic inflammatory change noted. Pancreas: Unremarkable. No pancreatic ductal dilatation or surrounding inflammatory changes. Spleen:  Unremarkable Adrenals/Urinary Tract: The adrenal glands are unremarkable. The kidneys are normal in size and position. Moderate asymmetric right renal cortical scarring is present. The kidneys are otherwise unremarkable. The bladder is mildly distended but is otherwise unremarkable. Stomach/Bowel: Stomach is within normal limits. Appendix appears normal. No evidence of bowel wall thickening, distention, or  inflammatory changes. Vascular/Lymphatic: No significant vascular findings are present. No enlarged abdominal or pelvic lymph nodes. Reproductive: 3.1 cm simple appearing cyst within the right ovary likely represents a follicular cyst in the premenopausal patient. No follow-up imaging is recommended. Pelvic organs are otherwise unremarkable. Other: No abdominal wall hernia or abnormality. No abdominopelvic ascites. Musculoskeletal: No acute or significant osseous findings. IMPRESSION: 1. Moderate periportal edema, nonspecific. This may be hydrostatic, as can be seen with cardiogenic failure, or inflammatory, as can be seen with acute hepatitis or cholangitis. Correlation with liver enzymes may be helpful for further evaluation. 2. Cholelithiasis without CT evidence of acute cholecystitis. Electronically Signed   By: Helyn Numbers M.D.   On: 09/29/2022 21:53    Procedures Procedures  {Document cardiac monitor, telemetry assessment procedure when appropriate:1}  Medications Ordered in ED Medications  HYDROmorphone (DILAUDID) injection 1 mg (1 mg Intravenous Given 09/29/22 1947)  ondansetron (ZOFRAN) injection 4 mg (4 mg Intravenous Given 09/29/22 1947)  sodium chloride 0.9 % bolus 1,000 mL (0 mLs Intravenous Stopped 09/29/22 2126)  iohexol (OMNIPAQUE) 300 MG/ML solution 100 mL (75 mLs Intravenous Contrast Given 09/29/22 2139)  pantoprazole (PROTONIX) injection 40 mg (40 mg Intravenous Given 09/29/22 2126)    ED Course/ Medical Decision Making/ A&P  CT scan shows gallstones but no cholecystitis.  I discussed the results of the CT scan with Dr. Henreitta Leber general surgery and she states that the patient was feeling better she can be followed up in the office                         Medical Decision Making Amount and/or Complexity of Data Reviewed Labs: ordered. Radiology: ordered.  Risk Prescription drug management.   Patient with abdominal pain and gallstones very mild elevated liver enzymes.   Patient given Percocets and Zofran and will follow-up with general surgery  {Document critical care time when appropriate:1} {Document review of labs and clinical decision tools ie heart score, Chads2Vasc2 etc:1}  {Document your independent review of radiology images, and any outside records:1} {Document your discussion with family members, caretakers, and with consultants:1} {Document social determinants of health affecting pt's care:1} {Document your decision making why or why not admission, treatments were needed:1} Final Clinical Impression(s) / ED Diagnoses Final diagnoses:  Pain of upper abdomen    Rx / DC Orders ED Discharge Orders          Ordered    oxyCODONE-acetaminophen (PERCOCET/ROXICET) 5-325 MG tablet  Every 6 hours PRN        09/29/22 2246    ondansetron (ZOFRAN-ODT) 4 MG disintegrating tablet        09/29/22 2246    oxyCODONE-acetaminophen (PERCOCET/ROXICET) 5-325 MG tablet  Every 6 hours PRN,   Status:  Discontinued        09/29/22 2246    oxyCODONE-acetaminophen (PERCOCET/ROXICET) 5-325 MG tablet  Every 6 hours PRN        09/29/22 2247

## 2022-09-29 NOTE — ED Triage Notes (Signed)
Pt c/o sudden severe mid epigastric chest pain that started about 30 mins PTA. Pt denies cardiac hx.

## 2022-10-01 ENCOUNTER — Ambulatory Visit (INDEPENDENT_AMBULATORY_CARE_PROVIDER_SITE_OTHER): Payer: Commercial Managed Care - PPO | Admitting: Surgery

## 2022-10-01 ENCOUNTER — Encounter: Payer: Self-pay | Admitting: Surgery

## 2022-10-01 VITALS — BP 167/84 | HR 60 | Temp 97.0°F | Resp 12 | Ht 62.0 in | Wt 183.0 lb

## 2022-10-01 DIAGNOSIS — K8020 Calculus of gallbladder without cholecystitis without obstruction: Secondary | ICD-10-CM

## 2022-10-01 NOTE — Progress Notes (Signed)
[facility] History and Physical  Reason for Referral:Cholelithiasis Referring Physician: ED referral  Chief Complaint   New Patient (Initial Visit)     Sarah Huff is a 46 y.o. female.  HPI: Patient presents for evaluation of cholelithiasis.  She began having epigastric crampy abdominal pain that radiated up into her chest after eating candy and cookies on 10/31.  She presented to the emergency department for evaluation.  She denied episodes of nausea, vomiting, or issues with bowel movements.  She was given Zofran and pain medications while in the emergency department, and felt better.  She underwent a CT abdomen and pelvis which demonstrated moderate periportal edema, which was noted to be nonspecific, and cholelithiasis without evidence of acute cholecystitis.  She had a mild leukocytosis of 13.2 and mild elevation of her AST, but otherwise normal LFTs.  She was advised to follow-up with general surgery outpatient.  She states she has had previous episodes of crampy epigastric abdominal pain, but it occurs in the middle of the night.  She denies food associations with her pain.  She has no significant past medical history.  She was being evaluated by hematology for a chronically elevated leukocytosis of unknown origin.  Her surgical history is significant for C-section, uterine ablation, and tubal ligation.  She denies use of blood thinning medications.  She denies use of tobacco products, alcohol, and illicit drugs.  Past Medical History:  Diagnosis Date   Allergy     Past Surgical History:  Procedure Laterality Date   BACK SURGERY  05/2019   CESAREAN SECTION  05/2006   TUBAL LIGATION  2007   uterine ablation  2008   VAGINAL DELIVERY  1995    Family History  Problem Relation Age of Onset   Stroke Father    Diabetes Father     Social History   Tobacco Use   Smoking status: Former    Packs/day: 1.50    Years: 14.00    Total pack years: 21.00     Types: Cigarettes    Quit date: 01/15/2006    Years since quitting: 16.7   Smokeless tobacco: Never  Substance Use Topics   Alcohol use: Never   Drug use: Never    Medications: I have reviewed the patient's current medications. Allergies as of 10/01/2022   No Known Allergies      Medication List        Accurate as of October 01, 2022  3:59 PM. If you have any questions, ask your nurse or doctor.          STOP taking these medications    allopurinol 100 MG tablet Commonly known as: ZYLOPRIM Stopped by: Shakyra Mattera A Ayad Nieman, DO       TAKE these medications    ibuprofen 200 MG tablet Commonly known as: ADVIL Take 400 mg by mouth every 6 (six) hours as needed for moderate pain.   ondansetron 4 MG disintegrating tablet Commonly known as: ZOFRAN-ODT 4mg  ODT q4 hours prn nausea/vomit   oxyCODONE-acetaminophen 5-325 MG tablet Commonly known as: PERCOCET/ROXICET Take 1 tablet by mouth every 6 (six) hours as needed for severe pain. What changed: Another medication with the same name was removed. Continue taking this medication, and follow the directions you see here. Changed by: Izeah Vossler A Ionna Avis, DO   Vitamin D3 1.25 MG (50000 UT) Caps Take 1 capsule by mouth once a week.         ROS:  Constitutional: negative for chills, fatigue, and fevers  Eyes: negative for visual disturbance and pain Ears, nose, mouth, throat, and face: negative for ear drainage, sore throat, and sinus problems Respiratory: negative for cough, wheezing, and shortness of breath Cardiovascular: negative for chest pain and palpitations Gastrointestinal: positive for abdominal pain, negative for nausea, reflux symptoms, and vomiting Genitourinary:negative for dysuria, frequency, and urinary retention Integument/breast: negative for dryness and rash Hematologic/lymphatic: negative for bleeding and lymphadenopathy Musculoskeletal:negative for back pain and neck pain Neurological:  negative for dizziness, tremors, and numbness Endocrine: negative for temperature intolerance  Blood pressure (!) 167/84, pulse 60, temperature (!) 97 F (36.1 C), temperature source Oral, resp. rate 12, height 5\' 2"  (1.575 m), weight 183 lb (83 kg), SpO2 96 %. Physical Exam Vitals reviewed.  Constitutional:      Appearance: Normal appearance.  HENT:     Head: Normocephalic and atraumatic.  Eyes:     Extraocular Movements: Extraocular movements intact.     Pupils: Pupils are equal, round, and reactive to light.  Cardiovascular:     Rate and Rhythm: Normal rate and regular rhythm.  Pulmonary:     Effort: Pulmonary effort is normal.     Breath sounds: Normal breath sounds.  Abdominal:     Comments: Abdomen soft, nondistended, no percussion tenderness, nontender to palpation; no rigidity, guarding, rebound tenderness; negative Murphy sign  Musculoskeletal:        General: Normal range of motion.     Cervical back: Normal range of motion.  Skin:    General: Skin is warm and dry.  Neurological:     General: No focal deficit present.     Mental Status: She is alert and oriented to person, place, and time.  Psychiatric:        Mood and Affect: Mood normal.        Behavior: Behavior normal.     Results: Results for orders placed or performed during the hospital encounter of 09/29/22 (from the past 48 hour(s))  Basic metabolic panel     Status: Abnormal   Collection Time: 09/29/22  7:43 PM  Result Value Ref Range   Sodium 137 135 - 145 mmol/L   Potassium 3.5 3.5 - 5.1 mmol/L   Chloride 108 98 - 111 mmol/L   CO2 22 22 - 32 mmol/L   Glucose, Bld 109 (H) 70 - 99 mg/dL    Comment: Glucose reference range applies only to samples taken after fasting for at least 8 hours.   BUN 18 6 - 20 mg/dL   Creatinine, Ser 0.72 0.44 - 1.00 mg/dL   Calcium 8.8 (L) 8.9 - 10.3 mg/dL   GFR, Estimated >60 >60 mL/min    Comment: (NOTE) Calculated using the CKD-EPI Creatinine Equation (2021)     Anion gap 7 5 - 15    Comment: Performed at [facility], [address] [place], [place] [address]  CBC     Status: Abnormal   Collection Time: 09/29/22  7:43 PM  Result Value Ref Range   WBC 13.2 (H) 4.0 - 10.5 K/uL   RBC 4.07 3.87 - 5.11 MIL/uL   Hemoglobin 11.3 (L) 12.0 - 15.0 g/dL   HCT 34.4 (L) 36.0 - 46.0 %   MCV 84.5 80.0 - 100.0 fL   MCH 27.8 26.0 - 34.0 pg   MCHC 32.8 30.0 - 36.0 g/dL   RDW 14.4 11.5 - 15.5 %   Platelets 321 150 - 400 K/uL   nRBC 0.0 0.0 - 0.2 %    Comment: Performed at [facility], [address], [place], [place] [address]  Troponin I (High Sensitivity)     Status: None   Collection Time: 09/29/22  7:43 PM  Result Value Ref Range   Troponin I (High Sensitivity) <2 <18 ng/L    Comment: (NOTE) Elevated high sensitivity troponin I (hsTnI) values and significant  changes across serial measurements may suggest ACS but many other  chronic and acute conditions are known to elevate hsTnI results.  Refer to the Links section for chest pain algorithms and additional  guidance. Performed at [facility], [address] [place], [place] [address]   Hepatic function panel     Status: Abnormal   Collection Time: 09/29/22  7:43 PM  Result Value Ref Range   Total Protein 7.3 6.5 - 8.1 g/dL   Albumin 3.9 3.5 - 5.0 g/dL   AST 50 (H) 15 - 41 U/L   ALT 24 0 - 44 U/L   Alkaline Phosphatase 69 38 - 126 U/L   Total Bilirubin 0.4 0.3 - 1.2 mg/dL   Bilirubin, Direct <0.1 0.0 - 0.2 mg/dL   Indirect Bilirubin NOT CALCULATED 0.3 - 0.9 mg/dL    Comment: Performed at  [facility], [address], [place], [place] [address]  Lipase, blood     Status: None   Collection Time: 09/29/22  7:43 PM  Result Value Ref Range   Lipase 30 11 - 51 U/L    Comment: Performed at [facility], [address], [place], [place] [address]  Troponin I (High Sensitivity)     Status: None   Collection Time: 09/29/22  9:55 PM  Result Value Ref Range   Troponin I (High Sensitivity) 2 <18 ng/L     Comment: (NOTE) Elevated high sensitivity troponin I (hsTnI) values and significant  changes across serial measurements may suggest ACS but many other  chronic and acute conditions are known to elevate hsTnI results.  Refer to the "Links" section for chest pain algorithms and additional  guidance. Performed at [facility], [address], [place], [place] [address]     CT ABDOMEN PELVIS W CONTRAST  Result Date: 09/29/2022 CLINICAL DATA:  Abdominal pain, acute, nonlocalized. Epigastric abdominal pain. EXAM: CT ABDOMEN AND PELVIS WITH CONTRAST TECHNIQUE: Multidetector CT imaging of the abdomen and pelvis was performed using the standard protocol following bolus administration of intravenous contrast. RADIATION DOSE REDUCTION: This exam was performed according to the departmental dose-optimization program which includes automated exposure control, adjustment of the mA and/or kV according to patient size and/or use of iterative reconstruction technique. CONTRAST:  69mL OMNIPAQUE IOHEXOL 300 MG/ML  SOLN COMPARISON:  None Available. FINDINGS: Lower chest: No acute abnormality. Hepatobiliary: There is moderate periportal edema present. No intra or extrahepatic biliary ductal dilation. No enhancing intrahepatic mass. The portal vein is patent. Cholelithiasis without pericholecystic inflammatory change noted. Pancreas: Unremarkable. No pancreatic ductal dilatation or surrounding inflammatory changes. Spleen: Unremarkable Adrenals/Urinary Tract: The adrenal glands are unremarkable. The kidneys are normal in size and position. Moderate asymmetric right renal cortical scarring is present. The kidneys are otherwise unremarkable. The bladder is mildly distended but is otherwise unremarkable. Stomach/Bowel: Stomach is within normal limits. Appendix appears normal. No evidence of bowel wall thickening, distention, or inflammatory changes. Vascular/Lymphatic: No significant vascular findings are present. No enlarged  abdominal or pelvic lymph nodes. Reproductive: 3.1 cm simple appearing cyst within the right ovary likely represents a follicular cyst in the premenopausal patient. No follow-up imaging is recommended. Pelvic organs are otherwise unremarkable. Other: No abdominal  wall hernia or abnormality. No abdominopelvic ascites. Musculoskeletal: No acute or significant osseous findings. IMPRESSION: 1. Moderate periportal edema, nonspecific. This may be hydrostatic, as can be seen with cardiogenic failure, or inflammatory, as can be seen with acute hepatitis or cholangitis. Correlation with liver enzymes may be helpful for further evaluation. 2. Cholelithiasis without CT evidence of acute cholecystitis. Electronically Signed   By: Fidela Salisbury M.D.   On: 09/29/2022 21:53     Assessment & Plan:  Sarah Huff is a 46 y.o. female who presents for evaluation of cholelithiasis.  -Explained the pathophysiology of gallbladder disease and why we recommend cholecystectomy.  I also explained that her pain may not fully resolve after cholecystectomy if it is related to gastritis or reflux -I counseled the patient about the indication, risks and benefits of laparoscopic cholecystectomy.  She understands there is a very small chance for bleeding, infection, injury to normal structures (including common bile duct), conversion to open surgery, persistent symptoms, evolution of postcholecystectomy diarrhea, need for secondary interventions, anesthesia reaction, cardiopulmonary issues and other risks not specifically detailed here. I described the expected recovery, the plan for follow-up and the restrictions during the recovery phase.  All questions were answered. -Patient tentatively scheduled for robotic cholecystectomy on 11/30 -Information provided to the patient regarding cholelithiasis, acute cholecystitis, cholecystectomy, and low-fat diet -Advised her to present to the emergency department if she begins to have  worsening epigastric/right upper quadrant abdominal pain, nausea, vomiting, fever, and chills  All questions were answered to the satisfaction of the patient.   Graciella Freer, DO [facility] [address] [place], [place] [address] [phone] (office)

## 2022-10-05 NOTE — H&P (Signed)
[facility] History and Physical  Reason for Referral:Cholelithiasis Referring Physician: ED referral  Chief Complaint   New Patient (Initial Visit)     Sarah Huff is a 46 y.o. female.  HPI: Patient presents for evaluation of cholelithiasis.  She began having epigastric crampy abdominal pain that radiated up into her chest after eating candy and cookies on 10/31.  She presented to the emergency department for evaluation.  She denied episodes of nausea, vomiting, or issues with bowel movements.  She was given Zofran and pain medications while in the emergency department, and felt better.  She underwent a CT abdomen and pelvis which demonstrated moderate periportal edema, which was noted to be nonspecific, and cholelithiasis without evidence of acute cholecystitis.  She had a mild leukocytosis of 13.2 and mild elevation of her AST, but otherwise normal LFTs.  She was advised to follow-up with general surgery outpatient.  She states she has had previous episodes of crampy epigastric abdominal pain, but it occurs in the middle of the night.  She denies food associations with her pain.  She has no significant past medical history.  She was being evaluated by hematology for a chronically elevated leukocytosis of unknown origin.  Her surgical history is significant for C-section, uterine ablation, and tubal ligation.  She denies use of blood thinning medications.  She denies use of tobacco products, alcohol, and illicit drugs.  Past Medical History:  Diagnosis Date   Allergy     Past Surgical History:  Procedure Laterality Date   BACK SURGERY  05/2019   CESAREAN SECTION  05/2006   TUBAL LIGATION  2007   uterine ablation  2008   VAGINAL DELIVERY  1995    Family History  Problem Relation Age of Onset   Stroke Father    Diabetes Father     Social History   Tobacco Use   Smoking status: Former    Packs/day: 1.50    Years: 14.00    Total pack years: 21.00     Types: Cigarettes    Quit date: 01/15/2006    Years since quitting: 16.7   Smokeless tobacco: Never  Substance Use Topics   Alcohol use: Never   Drug use: Never    Medications: I have reviewed the patient's current medications. Allergies as of 10/01/2022   No Known Allergies      Medication List        Accurate as of October 01, 2022  3:59 PM. If you have any questions, ask your nurse or doctor.          STOP taking these medications    allopurinol 100 MG tablet Commonly known as: ZYLOPRIM Stopped by: Mical Kicklighter A Tomeko Scoville, DO       TAKE these medications    ibuprofen 200 MG tablet Commonly known as: ADVIL Take 400 mg by mouth every 6 (six) hours as needed for moderate pain.   ondansetron 4 MG disintegrating tablet Commonly known as: ZOFRAN-ODT 4mg ODT q4 hours prn nausea/vomit   oxyCODONE-acetaminophen 5-325 MG tablet Commonly known as: PERCOCET/ROXICET Take 1 tablet by mouth every 6 (six) hours as needed for severe pain. What changed: Another medication with the same name was removed. Continue taking this medication, and follow the directions you see here. Changed by: Jaylena Holloway A Tyre Beaver, DO   Vitamin D3 1.25 MG (50000 UT) Caps Take 1 capsule by mouth once a week.         ROS:  Constitutional: negative for chills, fatigue, and fevers   Eyes: negative for visual disturbance and pain Ears, nose, mouth, throat, and face: negative for ear drainage, sore throat, and sinus problems Respiratory: negative for cough, wheezing, and shortness of breath Cardiovascular: negative for chest pain and palpitations Gastrointestinal: positive for abdominal pain, negative for nausea, reflux symptoms, and vomiting Genitourinary:negative for dysuria, frequency, and urinary retention Integument/breast: negative for dryness and rash Hematologic/lymphatic: negative for bleeding and lymphadenopathy Musculoskeletal:negative for back pain and neck pain Neurological:  negative for dizziness, tremors, and numbness Endocrine: negative for temperature intolerance  Blood pressure (!) 167/84, pulse 60, temperature (!) 97 F (36.1 C), temperature source Oral, resp. rate 12, height 5\' 2"  (1.575 m), weight 183 lb (83 kg), SpO2 96 %. Physical Exam Vitals reviewed.  Constitutional:      Appearance: Normal appearance.  HENT:     Head: Normocephalic and atraumatic.  Eyes:     Extraocular Movements: Extraocular movements intact.     Pupils: Pupils are equal, round, and reactive to light.  Cardiovascular:     Rate and Rhythm: Normal rate and regular rhythm.  Pulmonary:     Effort: Pulmonary effort is normal.     Breath sounds: Normal breath sounds.  Abdominal:     Comments: Abdomen soft, nondistended, no percussion tenderness, nontender to palpation; no rigidity, guarding, rebound tenderness; negative Murphy sign  Musculoskeletal:        General: Normal range of motion.     Cervical back: Normal range of motion.  Skin:    General: Skin is warm and dry.  Neurological:     General: No focal deficit present.     Mental Status: She is alert and oriented to person, place, and time.  Psychiatric:        Mood and Affect: Mood normal.        Behavior: Behavior normal.     Results: Results for orders placed or performed during the hospital encounter of 09/29/22 (from the past 48 hour(s))  Basic metabolic panel     Status: Abnormal   Collection Time: 09/29/22  7:43 PM  Result Value Ref Range   Sodium 137 135 - 145 mmol/L   Potassium 3.5 3.5 - 5.1 mmol/L   Chloride 108 98 - 111 mmol/L   CO2 22 22 - 32 mmol/L   Glucose, Bld 109 (H) 70 - 99 mg/dL    Comment: Glucose reference range applies only to samples taken after fasting for at least 8 hours.   BUN 18 6 - 20 mg/dL   Creatinine, Ser 0.72 0.44 - 1.00 mg/dL   Calcium 8.8 (L) 8.9 - 10.3 mg/dL   GFR, Estimated >60 >60 mL/min    Comment: (NOTE) Calculated using the CKD-EPI Creatinine Equation (2021)     Anion gap 7 5 - 15    Comment: Performed at [facility], [address] [place], [place] [address]  CBC     Status: Abnormal   Collection Time: 09/29/22  7:43 PM  Result Value Ref Range   WBC 13.2 (H) 4.0 - 10.5 K/uL   RBC 4.07 3.87 - 5.11 MIL/uL   Hemoglobin 11.3 (L) 12.0 - 15.0 g/dL   HCT 34.4 (L) 36.0 - 46.0 %   MCV 84.5 80.0 - 100.0 fL   MCH 27.8 26.0 - 34.0 pg   MCHC 32.8 30.0 - 36.0 g/dL   RDW 14.4 11.5 - 15.5 %   Platelets 321 150 - 400 K/uL   nRBC 0.0 0.0 - 0.2 %    Comment: Performed at [facility], [address] [place], [place] [address]  Troponin I (High Sensitivity)     Status: None   Collection Time: 09/29/22  7:43 PM  Result Value Ref Range   Troponin I (High Sensitivity) <2 <18 ng/L    Comment: (NOTE) Elevated high sensitivity troponin I (hsTnI) values and significant  changes across serial measurements may suggest ACS but many other  chronic and acute conditions are known to elevate hsTnI results.  Refer to the Links section for chest pain algorithms and additional  guidance. Performed at [facility], [address] [place], [place] [address]   Hepatic function panel     Status: Abnormal   Collection Time: 09/29/22  7:43 PM  Result Value Ref Range   Total Protein 7.3 6.5 - 8.1 g/dL   Albumin 3.9 3.5 - 5.0 g/dL   AST 50 (H) 15 - 41 U/L   ALT 24 0 - 44 U/L   Alkaline Phosphatase 69 38 - 126 U/L   Total Bilirubin 0.4 0.3 - 1.2 mg/dL   Bilirubin, Direct <0.1 0.0 - 0.2 mg/dL   Indirect Bilirubin NOT CALCULATED 0.3 - 0.9 mg/dL    Comment: Performed at [facility], [address] [place], [place] [address]  Lipase, blood     Status: None   Collection Time: 09/29/22  7:43 PM  Result Value Ref Range   Lipase 30 11 - 51 U/L    Comment: Performed at [facility], [address] [place], [place] [address]  Troponin I (High Sensitivity)     Status: None   Collection Time: 09/29/22  9:55 PM  Result Value Ref Range   Troponin I (High Sensitivity) 2 <18 ng/L     Comment: (NOTE) Elevated high sensitivity troponin I (hsTnI) values and significant  changes across serial measurements may suggest ACS but many other  chronic and acute conditions are known to elevate hsTnI results.  Refer to the "Links" section for chest pain algorithms and additional  guidance. Performed at [facility], [address] [place], [place] [address]     CT ABDOMEN PELVIS W CONTRAST  Result Date: 09/29/2022 CLINICAL DATA:  Abdominal pain, acute, nonlocalized. Epigastric abdominal pain. EXAM: CT ABDOMEN AND PELVIS WITH CONTRAST TECHNIQUE: Multidetector CT imaging of the abdomen and pelvis was performed using the standard protocol following bolus administration of intravenous contrast. RADIATION DOSE REDUCTION: This exam was performed according to the departmental dose-optimization program which includes automated exposure control, adjustment of the mA and/or kV according to patient size and/or use of iterative reconstruction technique. CONTRAST:  75mL OMNIPAQUE IOHEXOL 300 MG/ML  SOLN COMPARISON:  None Available. FINDINGS: Lower chest: No acute abnormality. Hepatobiliary: There is moderate periportal edema present. No intra or extrahepatic biliary ductal dilation. No enhancing intrahepatic mass. The portal vein is patent. Cholelithiasis without pericholecystic inflammatory change noted. Pancreas: Unremarkable. No pancreatic ductal dilatation or surrounding inflammatory changes. Spleen: Unremarkable Adrenals/Urinary Tract: The adrenal glands are unremarkable. The kidneys are normal in size and position. Moderate asymmetric right renal cortical scarring is present. The kidneys are otherwise unremarkable. The bladder is mildly distended but is otherwise unremarkable. Stomach/Bowel: Stomach is within normal limits. Appendix appears normal. No evidence of bowel wall thickening, distention, or inflammatory changes. Vascular/Lymphatic: No significant vascular findings are present. No enlarged  abdominal or pelvic lymph nodes. Reproductive: 3.1 cm simple appearing cyst within the right ovary likely represents a follicular cyst in the premenopausal patient. No follow-up imaging is recommended. Pelvic organs are otherwise unremarkable. Other: No abdominal   wall hernia or abnormality. No abdominopelvic ascites. Musculoskeletal: No acute or significant osseous findings. IMPRESSION: 1. Moderate periportal edema, nonspecific. This may be hydrostatic, as can be seen with cardiogenic failure, or inflammatory, as can be seen with acute hepatitis or cholangitis. Correlation with liver enzymes may be helpful for further evaluation. 2. Cholelithiasis without CT evidence of acute cholecystitis. Electronically Signed   By: Ashesh  Parikh M.D.   On: 09/29/2022 21:53     Assessment & Plan:  Sarah Huff is a 46 y.o. female who presents for evaluation of cholelithiasis.  -Explained the pathophysiology of gallbladder disease and why we recommend cholecystectomy.  I also explained that her pain may not fully resolve after cholecystectomy if it is related to gastritis or reflux -I counseled the patient about the indication, risks and benefits of laparoscopic cholecystectomy.  She understands there is a very small chance for bleeding, infection, injury to normal structures (including common bile duct), conversion to open surgery, persistent symptoms, evolution of postcholecystectomy diarrhea, need for secondary interventions, anesthesia reaction, cardiopulmonary issues and other risks not specifically detailed here. I described the expected recovery, the plan for follow-up and the restrictions during the recovery phase.  All questions were answered. -Patient tentatively scheduled for robotic cholecystectomy on 11/30 -Information provided to the patient regarding cholelithiasis, acute cholecystitis, cholecystectomy, and low-fat diet -Advised her to present to the emergency department if she begins to have  worsening epigastric/right upper quadrant abdominal pain, nausea, vomiting, fever, and chills  All questions were answered to the satisfaction of the patient.   Zahrah Sutherlin, DO [facility] [address] [place], [place] [address] [phone] (office)      

## 2022-10-13 ENCOUNTER — Ambulatory Visit: Payer: Commercial Managed Care - PPO | Admitting: General Surgery

## 2022-10-21 NOTE — Patient Instructions (Signed)
Sarah Huff  10/21/2022     @PREFPERIOPPHARMACY @   Your procedure is scheduled on 10/29/2022.  Report to 10/31/2022 at 6:40 AM    Call this number if you have problems the morning of surgery:  [phone]  If you experience any cold or flu symptoms such as cough, fever, chills, shortness of breath, etc. between now and your scheduled surgery, please notify [phone] at the above number.   Remember:  Do not eat or drink after midnight.      Take these medicines the morning of surgery with A SIP OF WATER : Oxycodone    Do not wear jewelry, make-up or nail polish.  Do not wear lotions, powders, or perfumes, or deodorant.  Do not shave 48 hours prior to surgery.  Men may shave face and neck.  Do not bring valuables to the hospital.  [facility] is not responsible for any belongings or valuables.  Contacts, dentures or bridgework may not be worn into surgery.  Leave your suitcase in the car.  After surgery it may be brought to your room.  For patients admitted to the hospital, discharge time will be determined by your treatment team.  Patients discharged the day of surgery will not be allowed to drive home.   Name and phone number of your driver:   Family Special instructions:  N/A  Please read over the following fact sheets that you were given. Care and Recovery After Surgery  Minimally Invasive Cholecystectomy Minimally invasive cholecystectomy is surgery to remove the gallbladder. The gallbladder is a pear-shaped organ that lies beneath the liver on the right side of the body. The gallbladder stores bile, which is a fluid that helps the body digest fats. Cholecystectomy is often done to treat inflammation (irritation and swelling) of the gallbladder (cholecystitis). This condition is usually caused by a buildup of gallstones (cholelithiasis) in the gallbladder or when the fluid in the gall bladder becomes stagnant because gallstones get stuck in the ducts (tubes) and block  the flow of bile. This can result in inflammation and pain. In severe cases, emergency surgery may be required. This procedure is done through small incisions in the abdomen, instead of one large incision. It is also called laparoscopic surgery. A thin scope with a camera (laparoscope) is inserted through one incision. Then surgical instruments are inserted through the other incisions. In some cases, a minimally invasive surgery may need to be changed to a surgery that is done through a larger incision. This is called open surgery. Tell a health care provider about: Any allergies you have. All medicines you are taking, including vitamins, herbs, eye drops, creams, and over-the-counter medicines. Any problems you or family members have had with anesthetic medicines. Any bleeding problems you have. Any surgeries you have had. Any medical conditions you have. Whether you are pregnant or may be pregnant. What are the risks? Generally, this is a safe procedure. However, problems may occur, including: Infection. Bleeding. Allergic reactions to medicines. Damage to nearby structures or organs. A gallstone remaining in the common bile duct. The common bile duct carries bile from the gallbladder to the small intestine. A bile leak from the liver or cystic duct after your gallbladder is removed. What happens before the procedure? When to stop eating and drinking Follow instructions from your health care provider about what you may eat and drink before your procedure. These may include: 8 hours before the procedure Stop eating most foods. Do not eat meat, fried foods,  or fatty foods. Eat only light foods, such as toast or crackers. All liquids are okay except energy drinks and alcohol. 6 hours before the procedure Stop eating. Drink only clear liquids, such as water, clear fruit juice, black coffee, plain tea, and sports drinks. Do not drink energy drinks or alcohol. 2 hours before the  procedure Stop drinking all liquids. You may be allowed to take medicines with small sips of water. If you do not follow your health care provider's instructions, your procedure may be delayed or canceled. Medicines Ask your health care provider about: Changing or stopping your regular medicines. This is especially important if you are taking diabetes medicines or blood thinners. Taking medicines such as aspirin and ibuprofen. These medicines can thin your blood. Do not take these medicines unless your health care provider tells you to take them. Taking over-the-counter medicines, vitamins, herbs, and supplements. General instructions If you will be going home right after the procedure, plan to have a responsible adult: Take you home from the hospital or clinic. You will not be allowed to drive. Care for you for the time you are told. Do not use any products that contain nicotine or tobacco for at least 4 weeks before the procedure. These products include cigarettes, chewing tobacco, and vaping devices, such as e-cigarettes. If you need help quitting, ask your health care provider. Ask your health care provider: How your surgery site will be marked. What steps will be taken to help prevent infection. These may include: Removing hair at the surgery site. Washing skin with a germ-killing soap. Taking antibiotic medicine. What happens during the procedure?  An IV will be inserted into one of your veins. You will be given one or both of the following: A medicine to help you relax (sedative). A medicine to make you fall asleep (general anesthetic). Your surgeon will make several small incisions in your abdomen. The laparoscope will be inserted through one of the small incisions. The camera on the laparoscope will send images to a monitor in the operating room. This lets your surgeon see inside your abdomen. A gas will be pumped into your abdomen. This will expand your abdomen to give the  surgeon more room to perform the surgery. Other tools that are needed for the procedure will be inserted through the other incisions. The gallbladder will be removed through one of the incisions. Your common bile duct may be examined. If stones are found in the common bile duct, they may be removed. After your gallbladder has been removed, the incisions will be closed with stitches (sutures), staples, or skin glue. Your incisions will be covered with a bandage (dressing). The procedure may vary among health care providers and hospitals. What happens after the procedure? Your blood pressure, heart rate, breathing rate, and blood oxygen level will be monitored until you leave the hospital or clinic. You will be given medicines as needed to control your pain. You may have a drain placed in the incision. The drain will be removed a day or two after the procedure. Summary Minimally invasive cholecystectomy, also called laparoscopic cholecystectomy, is surgery to remove the gallbladder using small incisions. Tell your health care provider about all the medical conditions you have and all the medicines you are taking for those conditions. Before the procedure, follow instructions about when to stop eating and drinking and changing or stopping medicines. Plan to have a responsible adult care for you for the time you are told after you leave the hospital or  clinic. This information is not intended to replace advice given to you by your health care provider. Make sure you discuss any questions you have with your health care provider. Document Revised: 05/20/2021 Document Reviewed: 05/20/2021 Elsevier Patient Education  2023 [employer]  General Anesthesia, Adult General anesthesia is the use of medicine to make you fall asleep (unconscious) for a medical procedure. General anesthesia must be used for certain procedures. It is often recommended for surgery or procedures that: Last a long time. Require  you to be still or in an unusual position. Are major and can cause blood loss. Affect your breathing. The medicines used for general anesthesia are called general anesthetics. During general anesthesia, these medicines are given along with medicines that: Prevent pain. Control your blood pressure. Relax your muscles. Prevent nausea and vomiting after the procedure. Tell a health care provider about: Any allergies you have. All medicines you are taking, including vitamins, herbs, eye drops, creams, and over-the-counter medicines. Your history of any: Medical conditions you have, including: High blood pressure. Bleeding problems. Diabetes. Heart or lung conditions, such as: Heart failure. Sleep apnea. Asthma. Chronic obstructive pulmonary disease (COPD). Current or recent illnesses, such as: Upper respiratory, chest, or ear infections. Cough or fever. Tobacco or drug use, including marijuana or alcohol use. Depression or anxiety. Surgeries and types of anesthetics you have had. Problems you or family members have had with anesthetic medicines. Whether you are pregnant or may be pregnant. Whether you have any chipped or loose teeth, dentures, caps, bridgework, or issues with your mouth, swallowing, or choking. What are the risks? Your health care provider will talk with you about risks. These may include: Allergic reaction to the medicines. Lung and heart problems. Inhaling food or liquid from the stomach into the lungs (aspiration). Nerve injury. Injury to the lips, mouth, teeth, or gums. Stroke. Waking up during your procedure and being unable to move. This is rare. These problems are more likely to develop if you are having a major surgery or if you have an advanced or serious medical condition. You can prevent some of these complications by answering all of your health care provider's questions thoroughly and by following all instructions before your procedure. General  anesthesia can cause side effects, including: Nausea or vomiting. A sore throat or hoarseness from the breathing tube. Wheezing or coughing. Shaking chills or feeling cold. Body aches. Sleepiness. Confusion, agitation (delirium), or anxiety. What happens before the procedure? When to stop eating and drinking Follow instructions from your health care provider about what you may eat and drink before your procedure. If you do not follow your health care provider's instructions, your procedure may be delayed or canceled. Medicines Ask your health care provider about: Changing or stopping your regular medicines. These include any diabetes medicines or blood thinners you take. Taking medicines such as aspirin and ibuprofen. These medicines can thin your blood. Do not take them unless your health care provider tells you to. Taking over-the-counter medicines, vitamins, herbs, and supplements. General instructions Do not use any products that contain nicotine or tobacco for at least 4 weeks before the procedure. These products include cigarettes, chewing tobacco, and vaping devices, such as e-cigarettes. If you need help quitting, ask your health care provider. If you brush your teeth on the morning of the procedure, make sure to spit out all of the water and toothpaste. If told by your health care provider, bring your sleep apnea device with you to surgery (if applicable). If you  will be going home right after the procedure, plan to have a responsible adult: Take you home from the hospital or clinic. You will not be allowed to drive. Care for you for the time you are told. What happens during the procedure?  An IV will be inserted into one of your veins. You will be given one or more of the following through a face mask or IV: A sedative. This helps you relax. Anesthesia. This will: Numb certain areas of your body. Make you fall asleep for surgery. After you are unconscious, a breathing tube  may be inserted down your throat to help you breathe. This will be removed before you wake up. An anesthesia provider, such as an anesthesiologist, will stay with you throughout your procedure. The anesthesia provider will: Keep you comfortable and safe by continuing to give you medicines and adjusting the amount of medicine that you get. Monitor your blood pressure, heart rate, and oxygen levels to make sure that the anesthetics do not cause any problems. The procedure may vary among health care providers and hospitals. What happens after the procedure? Your blood pressure, temperature, heart rate, breathing rate, and blood oxygen level will be monitored until you leave the hospital or clinic. You will wake up in a recovery area. You may wake up slowly. You may be given medicine to help you with pain, nausea, or any other side effects from the anesthesia. Summary General anesthesia is the use of medicine to make you fall asleep (unconscious) for a medical procedure. Follow your health care provider's instructions about when to stop eating, drinking, or taking certain medicines before your procedure. Plan to have a responsible adult take you home from the hospital or clinic. This information is not intended to replace advice given to you by your health care provider. Make sure you discuss any questions you have with your health care provider. Document Revised: 02/12/2022 Document Reviewed: 02/12/2022 Elsevier Patient Education  2023 [employer]  How to Use Chlorhexidine Before Surgery Chlorhexidine gluconate (CHG) is a germ-killing (antiseptic) solution that is used to clean the skin. It can get rid of the bacteria that normally live on the skin and can keep them away for about 24 hours. To clean your skin with CHG, you may be given: A CHG solution to use in the shower or as part of a sponge bath. A prepackaged cloth that contains CHG. Cleaning your skin with CHG may help lower the risk for  infection: While you are staying in the intensive care unit of the hospital. If you have a vascular access, such as a central line, to provide short-term or long-term access to your veins. If you have a catheter to drain urine from your bladder. If you are on a ventilator. A ventilator is a machine that helps you breathe by moving air in and out of your lungs. After surgery. What are the risks? Risks of using CHG include: A skin reaction. Hearing loss, if CHG gets in your ears and you have a perforated eardrum. Eye injury, if CHG gets in your eyes and is not rinsed out. The CHG product catching fire. Make sure that you avoid smoking and flames after applying CHG to your skin. Do not use CHG: If you have a chlorhexidine allergy or have previously reacted to chlorhexidine. On babies younger than 45 months of age. How to use CHG solution Use CHG only as told by your health care provider, and follow the instructions on the label. Use the  full amount of CHG as directed. Usually, this is one bottle. During a shower Follow these steps when using CHG solution during a shower (unless your health care provider gives you different instructions): Start the shower. Use your normal soap and shampoo to wash your face and hair. Turn off the shower or move out of the shower stream. Pour the CHG onto a clean washcloth. Do not use any type of brush or rough-edged sponge. Starting at your neck, lather your body down to your toes. Make sure you follow these instructions: If you will be having surgery, pay special attention to the part of your body where you will be having surgery. Scrub this area for at least 1 minute. Do not use CHG on your head or face. If the solution gets into your ears or eyes, rinse them well with water. Avoid your genital area. Avoid any areas of skin that have broken skin, cuts, or scrapes. Scrub your back and under your arms. Make sure to wash skin folds. Let the lather sit on your  skin for 1-2 minutes or as long as told by your health care provider. Thoroughly rinse your entire body in the shower. Make sure that all body creases and crevices are rinsed well. Dry off with a clean towel. Do not put any substances on your body afterward--such as powder, lotion, or perfume--unless you are told to do so by your health care provider. Only use lotions that are recommended by the manufacturer. Put on clean clothes or pajamas. If it is the night before your surgery, sleep in clean sheets.  During a sponge bath Follow these steps when using CHG solution during a sponge bath (unless your health care provider gives you different instructions): Use your normal soap and shampoo to wash your face and hair. Pour the CHG onto a clean washcloth. Starting at your neck, lather your body down to your toes. Make sure you follow these instructions: If you will be having surgery, pay special attention to the part of your body where you will be having surgery. Scrub this area for at least 1 minute. Do not use CHG on your head or face. If the solution gets into your ears or eyes, rinse them well with water. Avoid your genital area. Avoid any areas of skin that have broken skin, cuts, or scrapes. Scrub your back and under your arms. Make sure to wash skin folds. Let the lather sit on your skin for 1-2 minutes or as long as told by your health care provider. Using a different clean, wet washcloth, thoroughly rinse your entire body. Make sure that all body creases and crevices are rinsed well. Dry off with a clean towel. Do not put any substances on your body afterward--such as powder, lotion, or perfume--unless you are told to do so by your health care provider. Only use lotions that are recommended by the manufacturer. Put on clean clothes or pajamas. If it is the night before your surgery, sleep in clean sheets. How to use CHG prepackaged cloths Only use CHG cloths as told by your health care  provider, and follow the instructions on the label. Use the CHG cloth on clean, dry skin. Do not use the CHG cloth on your head or face unless your health care provider tells you to. When washing with the CHG cloth: Avoid your genital area. Avoid any areas of skin that have broken skin, cuts, or scrapes. Before surgery Follow these steps when using a CHG cloth to clean  before surgery (unless your health care provider gives you different instructions): Using the CHG cloth, vigorously scrub the part of your body where you will be having surgery. Scrub using a back-and-forth motion for 3 minutes. The area on your body should be completely wet with CHG when you are done scrubbing. Do not rinse. Discard the cloth and let the area air-dry. Do not put any substances on the area afterward, such as powder, lotion, or perfume. Put on clean clothes or pajamas. If it is the night before your surgery, sleep in clean sheets.  For general bathing Follow these steps when using CHG cloths for general bathing (unless your health care provider gives you different instructions). Use a separate CHG cloth for each area of your body. Make sure you wash between any folds of skin and between your fingers and toes. Wash your body in the following order, switching to a new cloth after each step: The front of your neck, shoulders, and chest. Both of your arms, under your arms, and your hands. Your stomach and groin area, avoiding the genitals. Your right leg and foot. Your left leg and foot. The back of your neck, your back, and your buttocks. Do not rinse. Discard the cloth and let the area air-dry. Do not put any substances on your body afterward--such as powder, lotion, or perfume--unless you are told to do so by your health care provider. Only use lotions that are recommended by the manufacturer. Put on clean clothes or pajamas. Contact a health care provider if: Your skin gets irritated after scrubbing. You have  questions about using your solution or cloth. You swallow any chlorhexidine. Call your local poison control center (765-800-9752 in the [place] Get help right away if: Your eyes itch badly, or they become very red or swollen. Your skin itches badly and is red or swollen. Your hearing changes. You have trouble seeing. You have swelling or tingling in your mouth or throat. You have trouble breathing. These symptoms may represent a serious problem that is an emergency. Do not wait to see if the symptoms will go away. Get medical help right away. Call your local emergency services (911 in the [place] Do not drive yourself to the hospital. Summary Chlorhexidine gluconate (CHG) is a germ-killing (antiseptic) solution that is used to clean the skin. Cleaning your skin with CHG may help to lower your risk for infection. You may be given CHG to use for bathing. It may be in a bottle or in a prepackaged cloth to use on your skin. Carefully follow your health care provider's instructions and the instructions on the product label. Do not use CHG if you have a chlorhexidine allergy. Contact your health care provider if your skin gets irritated after scrubbing. This information is not intended to replace advice given to you by your health care provider. Make sure you discuss any questions you have with your health care provider. Document Revised: 03/16/2022 Document Reviewed: 01/27/2021 Elsevier Patient Education  2023 [employer].

## 2022-10-26 ENCOUNTER — Encounter (HOSPITAL_COMMUNITY): Payer: Self-pay

## 2022-10-26 ENCOUNTER — Other Ambulatory Visit: Payer: Self-pay

## 2022-10-26 ENCOUNTER — Encounter (HOSPITAL_COMMUNITY)
Admission: RE | Admit: 2022-10-26 | Discharge: 2022-10-26 | Disposition: A | Discharge status: Home / Self Care | Payer: Commercial Managed Care - PPO | Source: Ambulatory Visit | Attending: Surgery | Admitting: Surgery

## 2022-10-26 VITALS — BP 145/76 | HR 71 | Temp 97.8°F | Resp 18 | Ht 62.0 in | Wt 188.0 lb

## 2022-10-26 DIAGNOSIS — Z01818 Encounter for other preprocedural examination: Secondary | ICD-10-CM | POA: Diagnosis present

## 2022-10-26 LAB — TYPE AND SCREEN
ABO/RH(D): O POS
Antibody Screen: NEGATIVE

## 2022-10-26 LAB — POCT PREGNANCY, URINE: Preg Test, Ur: NEGATIVE

## 2022-10-28 MED ORDER — CHLORHEXIDINE GLUCONATE CLOTH 2 % EX PADS: 6.0000 | MEDICATED_PAD | Freq: Once | CUTANEOUS | Status: DC

## 2022-10-28 MED ORDER — DEXTROSE 5 % IV SOLN: 2.0000 g | INTRAVENOUS | Status: DC

## 2022-10-29 ENCOUNTER — Ambulatory Visit (HOSPITAL_COMMUNITY): Payer: Commercial Managed Care - PPO | Admitting: Anesthesiology

## 2022-10-29 ENCOUNTER — Other Ambulatory Visit: Payer: Self-pay

## 2022-10-29 ENCOUNTER — Ambulatory Visit (HOSPITAL_COMMUNITY)
Admission: RE | Admit: 2022-10-29 | Discharge: 2022-10-29 | Disposition: A | Discharge status: Home / Self Care | Payer: Commercial Managed Care - PPO | Attending: Surgery | Admitting: Surgery

## 2022-10-29 ENCOUNTER — Ambulatory Visit (HOSPITAL_BASED_OUTPATIENT_CLINIC_OR_DEPARTMENT_OTHER): Payer: Commercial Managed Care - PPO | Admitting: Anesthesiology

## 2022-10-29 ENCOUNTER — Encounter (HOSPITAL_COMMUNITY)
Admission: RE | Disposition: A | Discharge status: Home / Self Care | Payer: Self-pay | Source: Home / Self Care | Attending: Surgery

## 2022-10-29 DIAGNOSIS — K8020 Calculus of gallbladder without cholecystitis without obstruction: Secondary | ICD-10-CM

## 2022-10-29 DIAGNOSIS — Z87891 Personal history of nicotine dependence: Secondary | ICD-10-CM | POA: Diagnosis not present

## 2022-10-29 DIAGNOSIS — K8010 Calculus of gallbladder with chronic cholecystitis without obstruction: Secondary | ICD-10-CM | POA: Insufficient documentation

## 2022-10-29 LAB — ABO/RH: ABO/RH(D): O POS

## 2022-10-29 SURGERY — CHOLECYSTECTOMY, ROBOT-ASSISTED, LAPAROSCOPIC
Anesthesia: General | Site: Abdomen

## 2022-10-29 MED ORDER — ONDANSETRON HCL 4 MG/2ML IJ SOLN: 4.0000 mg | Freq: Once | INTRAMUSCULAR | Status: DC | PRN

## 2022-10-29 MED ORDER — ORAL CARE MOUTH RINSE: 15.0000 mL | Freq: Once | OROMUCOSAL | Status: DC

## 2022-10-29 MED ORDER — BUPIVACAINE LIPOSOME 1.3 % IJ SUSP
INTRAMUSCULAR | Status: DC | PRN
  Administered 2022-10-29: 20 mL

## 2022-10-29 MED ORDER — PHENYLEPHRINE HCL-NACL 20-0.9 MG/250ML-% IV SOLN
INTRAVENOUS | Status: DC | PRN
  Administered 2022-10-29: 20 ug/min via INTRAVENOUS

## 2022-10-29 MED ORDER — ROCURONIUM BROMIDE 100 MG/10ML IV SOLN
INTRAVENOUS | Status: DC | PRN
  Administered 2022-10-29: 20 mg via INTRAVENOUS
  Administered 2022-10-29: 80 mg via INTRAVENOUS

## 2022-10-29 MED ORDER — STERILE WATER FOR IRRIGATION IR SOLN
Status: DC | PRN
  Administered 2022-10-29: 500 mL

## 2022-10-29 MED ORDER — DEXAMETHASONE SODIUM PHOSPHATE 10 MG/ML IJ SOLN
INTRAMUSCULAR | Status: DC | PRN
  Administered 2022-10-29: 10 mg via INTRAVENOUS

## 2022-10-29 MED ORDER — FENTANYL CITRATE (PF) 250 MCG/5ML IJ SOLN
INTRAMUSCULAR | Status: AC
  Filled 2022-10-29: qty 5

## 2022-10-29 MED ORDER — FENTANYL CITRATE (PF) 100 MCG/2ML IJ SOLN
INTRAMUSCULAR | Status: DC | PRN
  Administered 2022-10-29: 50 ug via INTRAVENOUS
  Administered 2022-10-29: 100 ug via INTRAVENOUS
  Administered 2022-10-29 (×2): 50 ug via INTRAVENOUS

## 2022-10-29 MED ORDER — INDOCYANINE GREEN 25 MG IV SOLR
INTRAVENOUS | Status: AC
  Filled 2022-10-29: qty 10

## 2022-10-29 MED ORDER — PROPOFOL 10 MG/ML IV BOLUS
INTRAVENOUS | Status: DC | PRN
  Administered 2022-10-29: 120 mg via INTRAVENOUS

## 2022-10-29 MED ORDER — CHLORHEXIDINE GLUCONATE CLOTH 2 % EX PADS: 6.0000 | MEDICATED_PAD | Freq: Once | CUTANEOUS | Status: DC

## 2022-10-29 MED ORDER — BUPIVACAINE LIPOSOME 1.3 % IJ SUSP
INTRAMUSCULAR | Status: AC
  Filled 2022-10-29: qty 20

## 2022-10-29 MED ORDER — SODIUM CHLORIDE 0.9 % IV SOLN
2.0000 g | INTRAVENOUS | Status: AC
  Administered 2022-10-29: 2 g via INTRAVENOUS
  Filled 2022-10-29: qty 2

## 2022-10-29 MED ORDER — INDOCYANINE GREEN 25 MG IV SOLR
2.5000 mg | Freq: Once | INTRAVENOUS | Status: AC
  Administered 2022-10-29: 2.5 mg via INTRAVENOUS

## 2022-10-29 MED ORDER — DOCUSATE SODIUM 100 MG PO CAPS: 100.0000 mg | ORAL_CAPSULE | Freq: Two times a day (BID) | ORAL | 2 refills | Status: DC

## 2022-10-29 MED ORDER — MIDAZOLAM HCL 2 MG/2ML IJ SOLN
INTRAMUSCULAR | Status: AC
  Filled 2022-10-29: qty 2

## 2022-10-29 MED ORDER — PROPOFOL 500 MG/50ML IV EMUL
INTRAVENOUS | Status: DC | PRN
  Administered 2022-10-29: 50 ug/kg/min via INTRAVENOUS

## 2022-10-29 MED ORDER — ACETAMINOPHEN 500 MG PO TABS: 1000.0000 mg | ORAL_TABLET | Freq: Four times a day (QID) | ORAL | 0 refills | Status: AC

## 2022-10-29 MED ORDER — HYDROMORPHONE HCL 1 MG/ML IJ SOLN
0.2500 mg | INTRAMUSCULAR | Status: AC | PRN
  Administered 2022-10-29 (×4): 0.5 mg via INTRAVENOUS
  Filled 2022-10-29 (×4): qty 0.5

## 2022-10-29 MED ORDER — MEPERIDINE HCL 50 MG/ML IJ SOLN: 6.2500 mg | INTRAMUSCULAR | Status: DC | PRN

## 2022-10-29 MED ORDER — ONDANSETRON HCL 4 MG/2ML IJ SOLN
INTRAMUSCULAR | Status: DC | PRN
  Administered 2022-10-29: 4 mg via INTRAVENOUS

## 2022-10-29 MED ORDER — LACTATED RINGERS IV SOLN: INTRAVENOUS | Status: DC

## 2022-10-29 MED ORDER — SUGAMMADEX SODIUM 200 MG/2ML IV SOLN
INTRAVENOUS | Status: DC | PRN
  Administered 2022-10-29: 341.2 mg via INTRAVENOUS

## 2022-10-29 MED ORDER — OXYCODONE HCL 5 MG PO TABS: 5.0000 mg | ORAL_TABLET | Freq: Four times a day (QID) | ORAL | 0 refills | Status: DC | PRN

## 2022-10-29 MED ORDER — CHLORHEXIDINE GLUCONATE 0.12 % MT SOLN: 15.0000 mL | Freq: Once | OROMUCOSAL | Status: DC

## 2022-10-29 MED ORDER — MIDAZOLAM HCL 5 MG/5ML IJ SOLN
INTRAMUSCULAR | Status: DC | PRN
  Administered 2022-10-29: 2 mg via INTRAVENOUS

## 2022-10-29 MED ORDER — LIDOCAINE HCL (CARDIAC) PF 100 MG/5ML IV SOSY
PREFILLED_SYRINGE | INTRAVENOUS | Status: DC | PRN
  Administered 2022-10-29: 80 mg via INTRATRACHEAL

## 2022-10-29 SURGICAL SUPPLY — 48 items
ADH SKN CLS APL DERMABOND .7 (GAUZE/BANDAGES/DRESSINGS) ×1
APL PRP STRL LF DISP 70% ISPRP (MISCELLANEOUS) ×1
CANNULA REDUC XI 12-8 STAPL (CANNULA) ×1
CANNULA REDUCER 12-8 DVNC XI (CANNULA) ×1 IMPLANT
CHLORAPREP W/TINT 26 (MISCELLANEOUS) IMPLANT
CLIP LIGATING HEM O LOK PURPLE (MISCELLANEOUS) ×1 IMPLANT
COVER TIP SHEARS 8 DVNC (MISCELLANEOUS) ×1 IMPLANT
COVER TIP SHEARS 8MM DA VINCI (MISCELLANEOUS) ×1
DEFOGGER SCOPE WARMER CLEARIFY (MISCELLANEOUS) IMPLANT
DERMABOND ADVANCED .7 DNX12 (GAUZE/BANDAGES/DRESSINGS) ×1 IMPLANT
DRAPE ARM DVNC X/XI (DISPOSABLE) ×4 IMPLANT
DRAPE COLUMN DVNC XI (DISPOSABLE) ×1 IMPLANT
DRAPE DA VINCI XI ARM (DISPOSABLE) ×4
DRAPE DA VINCI XI COLUMN (DISPOSABLE) ×1
ELECT REM PT RETURN 9FT ADLT (ELECTROSURGICAL) ×1
ELECTRODE REM PT RTRN 9FT ADLT (ELECTROSURGICAL) ×1 IMPLANT
GLOVE BIO SURGEON STRL SZ7 (GLOVE) ×2 IMPLANT
GLOVE BIOGEL PI IND STRL 6.5 (GLOVE) ×2 IMPLANT
GLOVE ECLIPSE 6.5 STRL STRAW (GLOVE) IMPLANT
GLOVE SURG SS PI 6.5 STRL IVOR (GLOVE) ×2 IMPLANT
GOWN STRL REUS W/TWL LRG LVL3 (GOWN DISPOSABLE) IMPLANT
GRASPER SUT TROCAR 14GX15 (MISCELLANEOUS) IMPLANT
KIT PINK PAD W/HEAD ARE REST (MISCELLANEOUS) ×1
KIT PINK PAD W/HEAD ARM REST (MISCELLANEOUS) ×1 IMPLANT
MANIFOLD NEPTUNE II (INSTRUMENTS) ×1 IMPLANT
NDL HYPO 21X1.5 SAFETY (NEEDLE) IMPLANT
NDL INSUFFLATION 14GA 120MM (NEEDLE) ×1 IMPLANT
NEEDLE HYPO 21X1.5 SAFETY (NEEDLE) ×1 IMPLANT
NEEDLE INSUFFLATION 14GA 120MM (NEEDLE) ×1 IMPLANT
OBTURATOR OPTICAL STANDARD 8MM (TROCAR) ×1
OBTURATOR OPTICAL STND 8 DVNC (TROCAR) ×1
OBTURATOR OPTICALSTD 8 DVNC (TROCAR) ×1 IMPLANT
PACK LAP CHOLE LZT030E (CUSTOM PROCEDURE TRAY) ×1 IMPLANT
PENCIL SMOKE EVACUATOR (MISCELLANEOUS) ×1 IMPLANT
SEAL CANN UNIV 5-8 DVNC XI (MISCELLANEOUS) ×3 IMPLANT
SEAL XI 5MM-8MM UNIVERSAL (MISCELLANEOUS) ×4
SET BASIN LINEN APH (SET/KITS/TRAYS/PACK) ×1 IMPLANT
SET TUBE SMOKE EVAC HIGH FLOW (TUBING) ×1 IMPLANT
STAPLER CANNULA SEAL DVNC XI (STAPLE) ×1 IMPLANT
STAPLER CANNULA SEAL XI (STAPLE) ×1
SUT MNCRL AB 4-0 PS2 18 (SUTURE) ×2 IMPLANT
SUT VICRYL 0 UR6 27IN ABS (SUTURE) ×2 IMPLANT
SYR 20ML LL LF (SYRINGE) IMPLANT
SYS BAG RETRIEVAL 10MM (BASKET) ×1
SYS RETRIEVAL 5MM INZII UNIV (BASKET) ×1
SYSTEM BAG RETRIEVAL 10MM (BASKET) ×1 IMPLANT
SYSTEM RETRIEVL 5MM INZII UNIV (BASKET) IMPLANT
WATER STERILE IRR 500ML POUR (IV SOLUTION) ×1 IMPLANT

## 2022-10-29 NOTE — Anesthesia Postprocedure Evaluation (Signed)
Anesthesia Post Note  Patient: Hana A Stiefel  Procedure(s) Performed: XI ROBOTIC ASSISTED LAPAROSCOPIC CHOLECYSTECTOMY (Abdomen)  Patient location during evaluation: Phase II Anesthesia Type: General Level of consciousness: awake and alert and oriented Pain management: pain level controlled Vital Signs Assessment: post-procedure vital signs reviewed and stable Respiratory status: spontaneous breathing, nonlabored ventilation and respiratory function stable Cardiovascular status: blood pressure returned to baseline and stable Postop Assessment: no apparent nausea or vomiting Anesthetic complications: no  No notable events documented.   Last Vitals:  Vitals:   10/29/22 1315 10/29/22 1348  BP: 127/69 119/65  Pulse: 61 65  Resp: 13 15  Temp:  36.7 C  SpO2: 94% 95%    Last Pain:  Vitals:   10/29/22 1348  TempSrc: Oral  PainSc: 4                  Vennela Jutte C Jamus Loving

## 2022-10-29 NOTE — Anesthesia Procedure Notes (Signed)
Procedure Name: Intubation Date/Time: 10/29/2022 9:00 AM  Performed by: Minerva Ends, CRNAPre-anesthesia Checklist: Patient identified, Emergency Drugs available, Suction available and Patient being monitored Patient Re-evaluated:Patient Re-evaluated prior to induction Oxygen Delivery Method: Circle system utilized Preoxygenation: Pre-oxygenation with 100% oxygen Induction Type: IV induction Ventilation: Mask ventilation without difficulty Laryngoscope Size: Mac and 3 Grade View: Grade I Tube type: Oral Tube size: 7.0 mm Number of attempts: 1 Airway Equipment and Method: Stylet and Oral airway Placement Confirmation: ETT inserted through vocal cords under direct vision, positive ETCO2 and breath sounds checked- equal and bilateral Secured at: 22 cm Tube secured with: Tape Dental Injury: Teeth and Oropharynx as per pre-operative assessment

## 2022-10-29 NOTE — Anesthesia Preprocedure Evaluation (Signed)
Anesthesia Evaluation  Patient identified by MRN, date of birth, ID band Patient awake    Reviewed: Allergy & Precautions, H&P , NPO status , Patient's Chart, lab work & pertinent test results  Airway Mallampati: II  TM Distance: >3 FB Neck ROM: Full    Dental  (+) Dental Advisory Given, Missing, Chipped   Pulmonary former smoker   Pulmonary exam normal breath sounds clear to auscultation       Cardiovascular Exercise Tolerance: Good negative cardio ROS Normal cardiovascular exam Rhythm:Regular Rate:Normal     Neuro/Psych negative neurological ROS  negative psych ROS   GI/Hepatic negative GI ROS, Neg liver ROS,,,  Endo/Other  negative endocrine ROS    Renal/GU negative Renal ROS  negative genitourinary   Musculoskeletal negative musculoskeletal ROS (+)    Abdominal   Peds negative pediatric ROS (+)  Hematology negative hematology ROS (+)   Anesthesia Other Findings   Reproductive/Obstetrics negative OB ROS                             Anesthesia Physical Anesthesia Plan  ASA: 2  Anesthesia Plan: General   Post-op Pain Management: Dilaudid IV   Induction: Intravenous  PONV Risk Score and Plan: 4 or greater and Ondansetron, Dexamethasone, Midazolam and Scopolamine patch - Pre-op  Airway Management Planned: Oral ETT  Additional Equipment:   Intra-op Plan:   Post-operative Plan: Extubation in OR  Informed Consent: I have reviewed the patients History and Physical, chart, labs and discussed the procedure including the risks, benefits and alternatives for the proposed anesthesia with the patient or authorized representative who has indicated his/her understanding and acceptance.     Dental advisory given  Plan Discussed with: CRNA and Surgeon  Anesthesia Plan Comments:         Anesthesia Quick Evaluation

## 2022-10-29 NOTE — Progress Notes (Signed)
[facility]  Spoke with the patient's husband in the consultation room.  I explained that she tolerated the procedure without difficulty.  She has dissolvable stitches under the skin with overlying skin glue.  This will flake off in 10 to 14 days.  I discharged her home with a prescription for narcotic pain medication that they should take as needed for pain.  I also want her taking scheduled Tylenol.  If they take the narcotic pain medication, they should take a stool softener as well.  The patient will follow-up with me in 2 weeks for phone follow-uu.  All questions were answered to his expressed satisfaction.  [place] [id number] [facility] [address] [place], [place] [address] [phone] (office)

## 2022-10-29 NOTE — Interval H&P Note (Signed)
History and Physical Interval Note:  10/29/2022 8:32 AM  Sarah Huff  has presented today for surgery, with the diagnosis of CHOLELITHIASIS.  The various methods of treatment have been discussed with the patient and family. After consideration of risks, benefits and other options for treatment, the patient has consented to  Procedure(s): XI ROBOTIC ASSISTED LAPAROSCOPIC CHOLECYSTECTOMY (N/A) as a surgical intervention.  The patient's history has been reviewed, patient examined, no change in status, stable for surgery.  I have reviewed the patient's chart and labs.  Questions were answered to the patient's satisfaction.     Elcie Pelster A Eldora Napp

## 2022-10-29 NOTE — Transfer of Care (Signed)
Immediate Anesthesia Transfer of Care Note  Patient: Sarah Huff  Procedure(s) Performed: XI ROBOTIC ASSISTED LAPAROSCOPIC CHOLECYSTECTOMY (Abdomen)  Patient Location: PACU  Anesthesia Type:General  Level of Consciousness: awake, alert , and oriented  Airway & Oxygen Therapy: Patient Spontanous Breathing and Patient connected to face mask oxygen  Post-op Assessment: Report given to RN and Post -op Vital signs reviewed and stable  Post vital signs: Reviewed and stable  Last Vitals:  Vitals Value Taken Time  BP 123/52 10/29/22 1035  Temp    Pulse 66 10/29/22 1037  Resp 15 10/29/22 1037  SpO2 88 % 10/29/22 1037  Vitals shown include unvalidated device data.  Last Pain: There were no vitals filed for this visit.       Complications: No notable events documented.

## 2022-10-29 NOTE — Discharge Instructions (Signed)
Ambulatory Surgery Discharge Instructions  General Anesthesia or Sedation Do not drive or operate heavy machinery for 24 hours.  Do not consume alcohol, tranquilizers, sleeping medications, or any non-prescribed medications for 24 hours. Do not make important decisions or sign any important papers in the next 24 hours. You should have someone with you tonight at home.  Activity  You are advised to go directly home from the hospital.  Restrict your activities and rest for a day.  Resume light activity tomorrow. No heavy lifting over 10 lbs or strenuous exercise.  Fluids and Diet Begin with clear liquids, bouillon, dry toast, soda crackers.  If not nauseated, you may go to a regular diet when you desire.  Greasy and spicy foods are not advised.  Medications  If you have not had a bowel movement in 24 hours, take 2 tablespoons over the counter Milk of mag.             You May resume your blood thinners tomorrow (Aspirin, coumadin, or other).  You are being discharged with prescriptions for Opioid/Narcotic Medications: There are some specific considerations for these medications that you should know. Opioid Meds have risks & benefits. Addiction to these meds is always a concern with prolonged use Take medication only as directed Do not drive while taking narcotic pain medication Do not crush tablets or capsules Do not use a different container than medication was dispensed in Lock the container of medication in a cool, dry place out of reach of children and pets. Opioid medication can cause addiction Do not share with anyone else (this is a felony) Do not store medications for future use. Dispose of them properly.     Disposal:  Find a [facility] household drug take back site near you.  If you can't get to a drug take back site, use the recipe below as a last resort to dispose of expired, unused or unwanted drugs. Disposal  (Do not dispose chemotherapy drugs this way, talk to your  prescribing doctor instead.) Step 1: Mix drugs (do not crush) with dirt, kitty litter, or used coffee grounds and add a small amount of water to dissolve any solid medications. Step 2: Seal drugs in plastic bag. Step 3: Place plastic bag in trash. Step 4: Take prescription container and scratch out personal information, then recycle or throw away.  Operative Site  You have a liquid bandage over your incisions, this will begin to flake off in about a week. Ok to shower tomorrow. Keep wound clean and dry. No baths or swimming. No lifting more than 10 pounds.  Contact Information: If you have questions or concerns, please call our office, [phone], Monday- Thursday 8AM-5PM and Friday 8AM-12Noon.  If it is after hours or on the weekend, please call [facility] Main Number, [phone], and ask to speak to the surgeon on call for Dr. Justn Quale at [facility].   SPECIFIC COMPLICATIONS TO WATCH FOR: Inability to urinate Fever over 101? F by mouth Nausea and vomiting lasting longer than 24 hours. Pain not relieved by medication ordered Swelling around the operative site Increased redness, warmth, hardness, around operative area Numbness, tingling, or cold fingers or toes Blood -soaked dressing, (small amounts of oozing may be normal) Increasing and progressive drainage from surgical area or exam site  

## 2022-10-29 NOTE — Op Note (Signed)
[facility] Operative Note  Preoperative diagnosis:  cholelithiasis  Postoperative diagnosis: same as above  Procedure: Robotic assisted Laparoscopic Cholecystectomy.   Anesthesia: GETA   Surgeon: Theophilus Kinds, DO  Specimen: Gallbladder  Complications: None  EBL: 4mL  Wound Classification: Clean Contaminated  Indications: Patient is a 46 year old female who presents for robotic assisted laparoscopic cholecystectomy.  She had an episode of RUQ abdominal pain and was noted to have cholelithiasis on CT abdomen and pelvis.  She is agreeable to surgery at this time.  All risks and benefits of performing this procedure were discussed with the patient including pain, infection, bleeding, damage to the surrounding structures, and need for more procedures or surgery. The patient voiced understanding of the procedure, all questions were sought and answered, and consent was obtained.  Findings: Critical view of safety noted Cystic duct and artery identified, ligated and divided, clips remained intact at end of procedure Adequate hemostasis  Description of procedure:  The patient was placed on the operating table in the supine position. SCDs placed, pre-op abx administered.  General anesthesia was induced and OG tube placed by anesthesia. A time-out was completed verifying correct patient, procedure, site, positioning, and implant(s) and/or special equipment prior to beginning this procedure. The abdomen was prepped and draped in the usual sterile fashion.    Veress needle was placed at the Palmer's point and insufflation was started after confirming a positive saline drop test and no immediate increase in abdominal pressure.  After reaching 15 mm, the Veress needle was removed and a 8 mm port was placed via optiview technique under umbilicus measured 36mm from gallbladder.  The abdomen was inspected and no abnormalities or injuries were found.  Under direct vision,  ports were placed in the following locations: One 8 mm patient left of the umbilicus, 8cm from the optiviewed port, two 8 mm ports placed to the patient right of the umbilical port 8 cm apart.  Once ports were placed, The table was placed in the reverse Trendelenburg position with the right side up. The Xi platform was brought into the operative field and docked to the ports successfully.  An endoscope was placed through the umbilical port, prograsp through the adjacent patient right port, fenestrated bipolar to the far patient right port, and then a hook cautery in the left port.  The dome of the gallbladder was grasped with prograsp, passed and retracted over the dome of the liver. Adhesions between the gallbladder and omentum, duodenum and transverse colon were lysed via hook cautery. The infundibulum was grasped with the fenestrated grasper and retracted toward the right lower quadrant. This maneuver exposed Calot's triangle. The peritoneum overlying the gallbladder infundibulum was then dissected  and the cystic duct and cystic artery identified.  Critical view of safety with the liver bed clearly visible behind the duct and artery with no additional structures noted.  The cystic duct and cystic artery clipped and divided close to the gallbladder.     The gallbladder was then dissected from its peritoneal and liver bed attachments by electrocautery. Hemostasis was checked prior to removing the hook cautery and the Endo Catch bag was then placed through the left lateral port and the gallbladder was removed.  The gallbladder was passed off the table as a specimen. There was no evidence of bleeding from the gallbladder fossa or cystic artery or leakage of the bile from the cystic duct stump. The 8 mm left lateral port site closed with PMI using 0 vicryl  under direct vision.  Abdomen desufflated and secondary trocars were removed under direct vision. No bleeding was noted. All skin incisions then closed with  subcuticular sutures of 4-0 monocryl and dressed with topical skin adhesive. The orogastric tube was removed and patient extubated.  The patient tolerated the procedure well and was taken to the postanesthesia care unit in stable condition.  All sponge and instrument count correct at end of procedure.  Theophilus Kinds, DO [facility] [address] [place], [place] [address] [phone] (office)

## 2022-10-30 LAB — SURGICAL PATHOLOGY

## 2022-11-06 IMAGING — MG DIGITAL SCREENING BILAT W/ TOMO W/ CAD
8 series · 8 of 24 positions shown · non-contrast
Comparison: None.

CLINICAL DATA: Screening.

EXAM:
DIGITAL SCREENING BILATERAL MAMMOGRAM WITH TOMO AND CAD

[R MLO synth-2D]
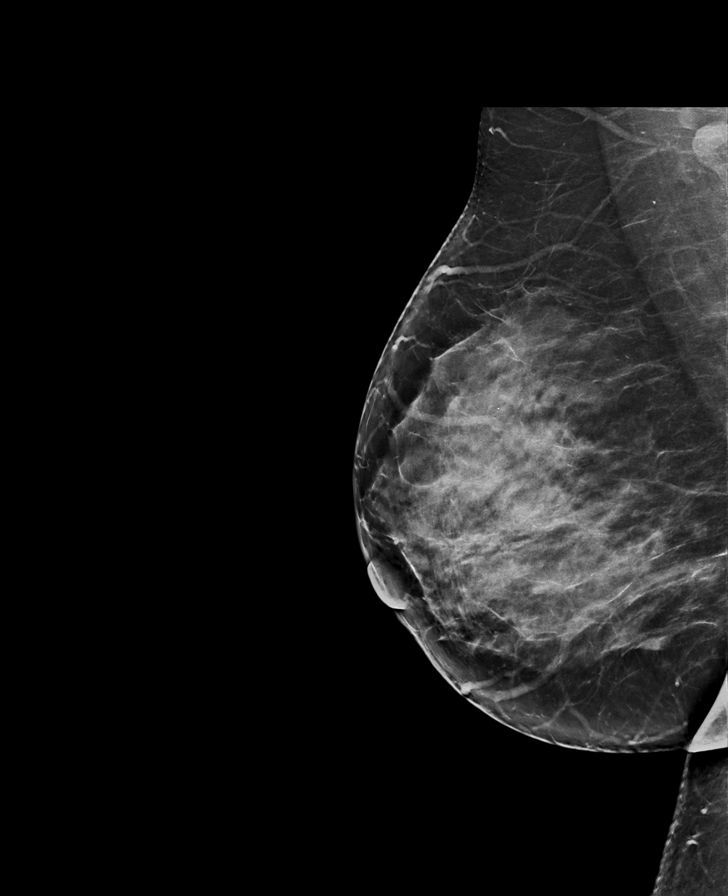

[L CC synth-2D]
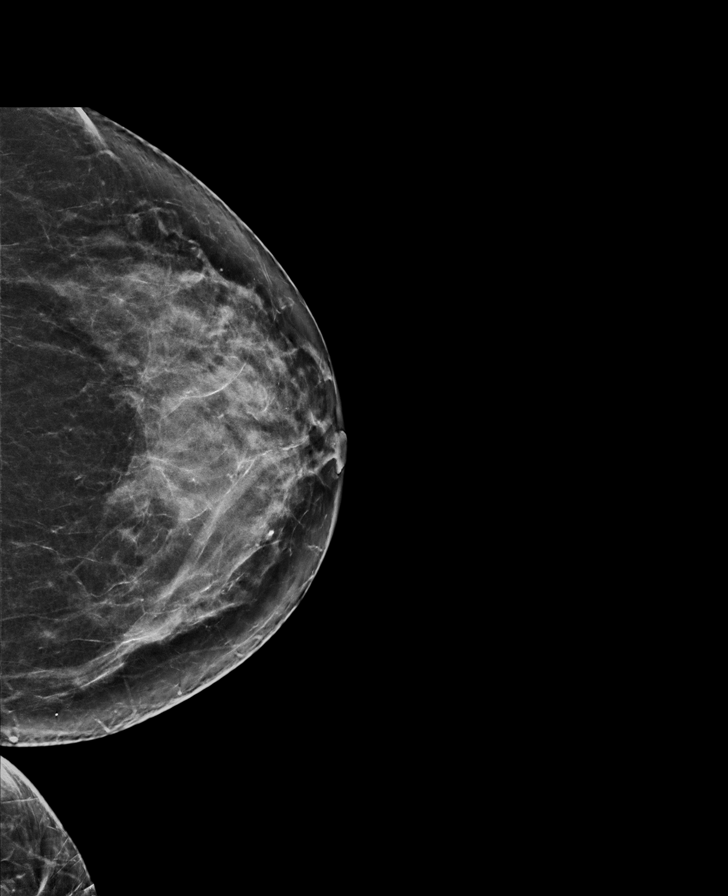

[L MLO synth-2D]
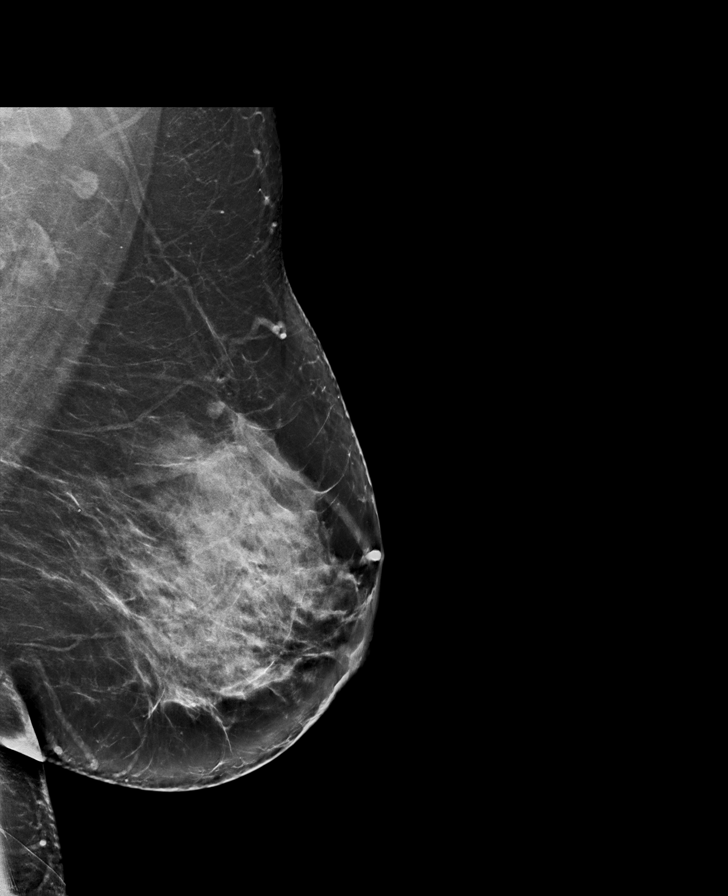

[R CC synth-2D]
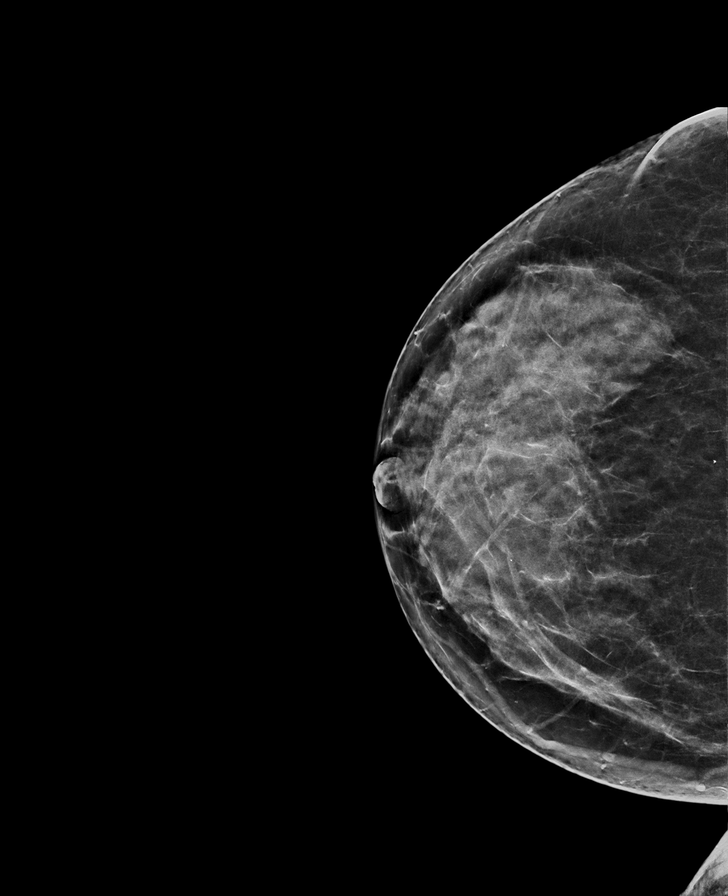

[R MLO tomo · tomo slice 39/78.0]
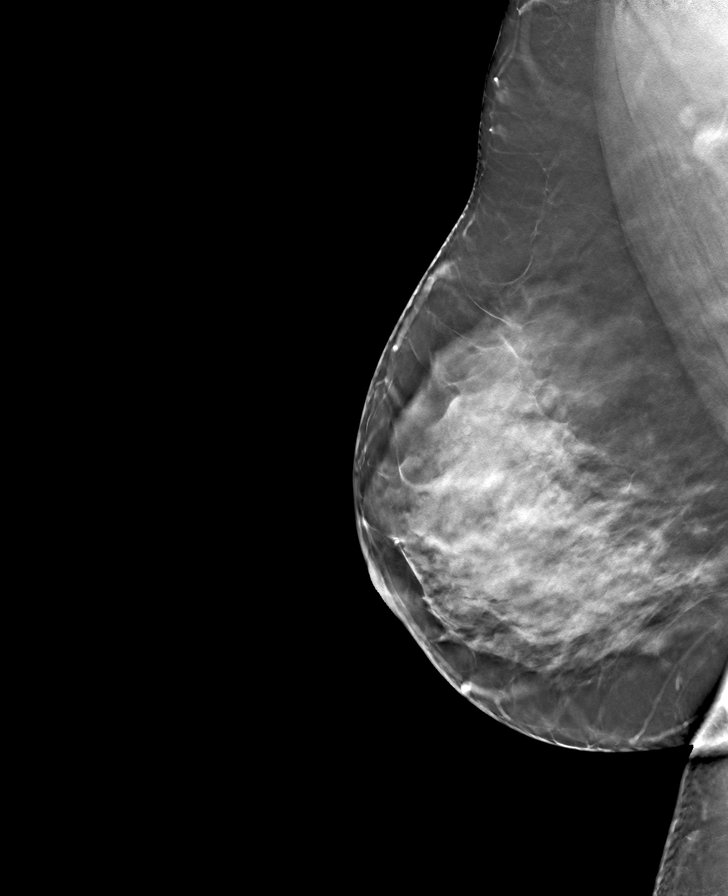

[L CC tomo · tomo slice 37/72.0]
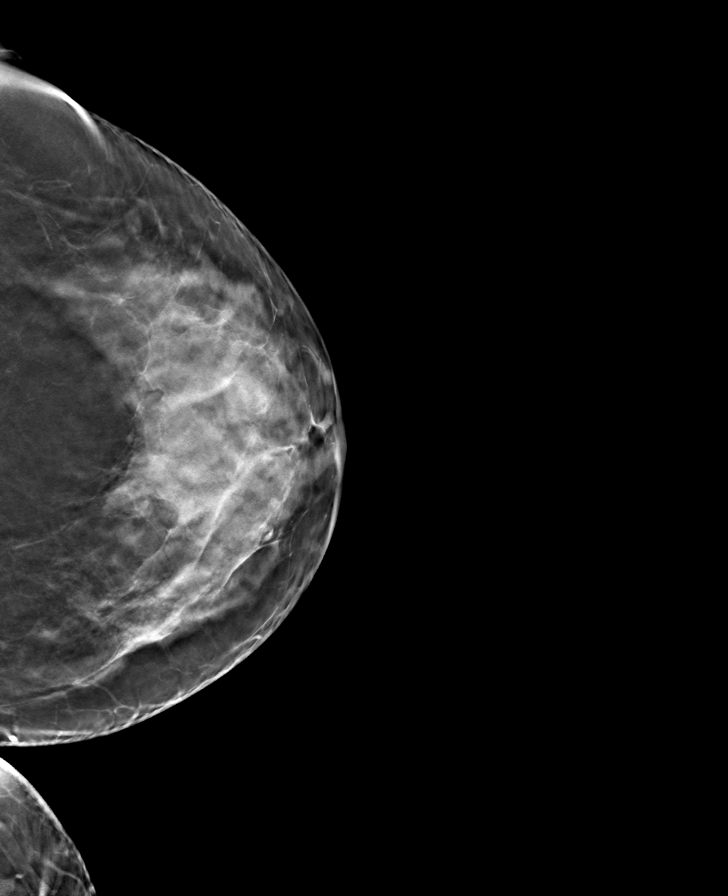

[R CC tomo · tomo slice 37/72.0]
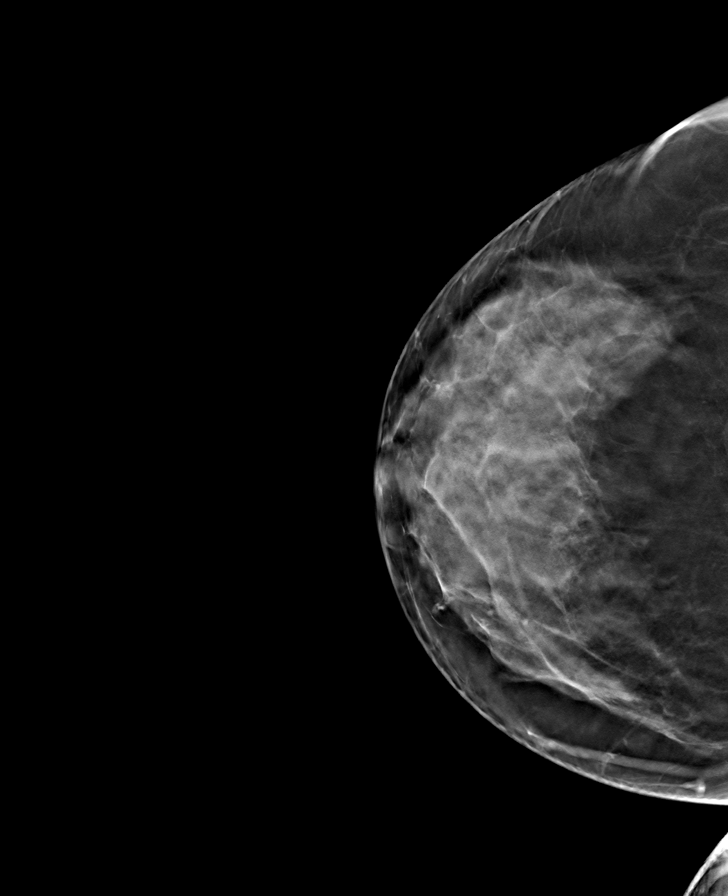

[L MLO tomo · tomo slice 43/84.0]
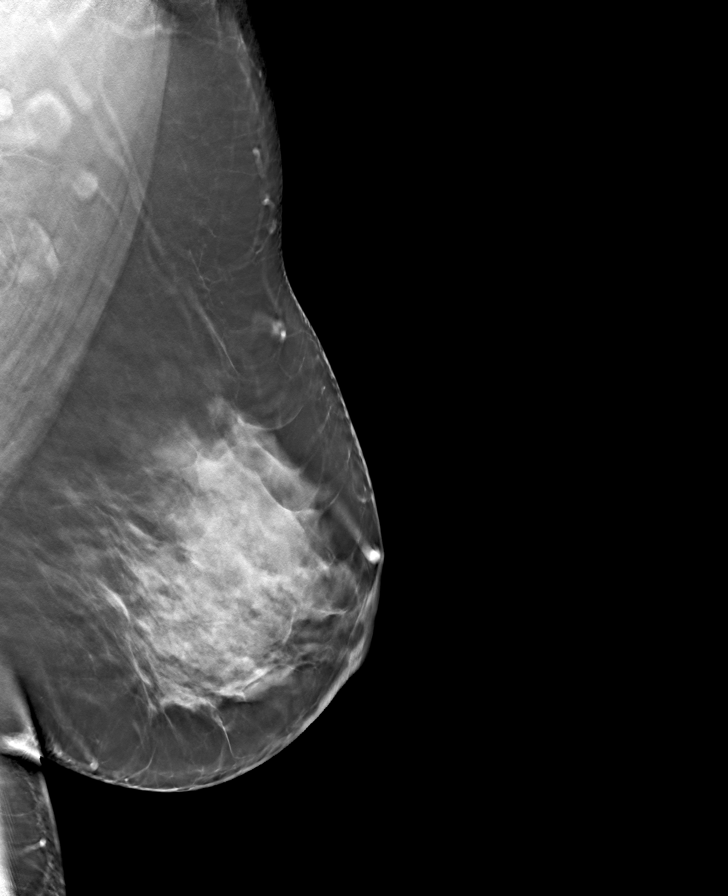

[8 of 24 positions shown; findings below may reference images not displayed]

ACR Breast Density Category d: The breast tissue is extremely dense,
which lowers the sensitivity of mammography.
FINDINGS: There are no findings suspicious for malignancy. Images were
processed with CAD.
IMPRESSION: No mammographic evidence of malignancy. A result letter of this
screening mammogram will be mailed directly to the patient.

RECOMMENDATION:
Screening mammogram in one year. (Code:F8-6-TBV)

BI-RADS CATEGORY  1: Negative.

## 2022-11-18 ENCOUNTER — Encounter: Payer: Self-pay | Admitting: *Deleted

## 2022-11-19 ENCOUNTER — Ambulatory Visit (INDEPENDENT_AMBULATORY_CARE_PROVIDER_SITE_OTHER): Payer: Commercial Managed Care - PPO | Admitting: Surgery

## 2022-11-19 DIAGNOSIS — K8020 Calculus of gallbladder without cholecystitis without obstruction: Secondary | ICD-10-CM

## 2022-11-19 DIAGNOSIS — Z09 Encounter for follow-up examination after completed treatment for conditions other than malignant neoplasm: Secondary | ICD-10-CM

## 2022-11-19 NOTE — Progress Notes (Signed)
[facility]  I am calling the patient for post operative evaluation. This is not a billable encounter as it is under the global charges for the surgery.  The patient had a laparoscopic cholecystectomy on 11/30. The patient reports that she is doing well. She is tolerating a diet, having good pain control, and having regular Bms.  The incisions are healing well.  She still has some occasional pains at her left lateral incision site. The patient has no concerns.   Pathology: A. GALLBLADDER, CHOLECYSTECTOMY:  - Chronic cholecystitis with cholelithiasis   Will see the patient PRN.   Theophilus Kinds, DO [facility] [address] [place] [place], [place] [address] [phone] (office)

## 2022-12-15 ENCOUNTER — Other Ambulatory Visit (HOSPITAL_COMMUNITY): Payer: Self-pay | Admitting: Internal Medicine

## 2022-12-15 DIAGNOSIS — Z1231 Encounter for screening mammogram for malignant neoplasm of breast: Secondary | ICD-10-CM

## 2022-12-22 NOTE — Progress Notes (Unsigned)
[facility] [address] [place] [address], [place] [address]   CLINIC:  Medical Oncology/Hematology  PCP:  Benita Stabile, MD [address] [place] [address] [phone]   REASON FOR VISIT:  Follow-up for leukocytosis   PRIOR THERAPY: None   CURRENT THERAPY: Observation  INTERVAL HISTORY:   Sarah Huff 47 y.o. female returns for routine follow-up of her leukocytosis.  She was last seen by Rojelio Brenner PA-C on 12/19/2021.  At today's visit, she reports feeling well.  She had laparoscopic cholecystectomy on 10/29/2022, but otherwise denies any recent hospitalizations, surgeries, or changes in baseline health status.  She denies having any frequent infections, although she is currently on antibiotics for UTI.  She had a steroid shot in October 2022.  She does not smoke or vape.  She has not noticed any new lumps or bumps.  She denies any B symptoms such as fever, chills, night sweats, unintentional weight loss.   She has 80% energy and 100% appetite. She endorses that she is maintaining a stable weight.   ASSESSMENT & PLAN:  1.   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 - JAK2/CALR/MPL and BCR/ABL testing were negative.  Flow cytometry was negative. - Ex-smoker, quit 14 years ago. - ANA/rheumatoid factor negative.  No known history of connective tissue disorder or rheumatologic diseases.  She had a steroid injection in October 2022.  No recent infections.  - No B symptoms or vasomotor symptoms  - No palpable lymphadenopathy or hepatosplenomegaly on exam  - Lab trend over the past year: 12/12/2021: Normal WBC 10.2 with mildly elevated ANC 7.8. 06/17/2022: WBC 10.3, normal differential 09/29/2022: WBC 13.2 (no differential) - elevated in the setting of emergency department visit for acute gallbladder pain - Labs TODAY  (12/23/2022): WBC 10.3 with normal differential.  Normal LDH. - DIFFERENTIAL DIAGNOSIS: Intermittent self-resolving leukocytosis favors diagnosis of reactive leukocytosis, possibly in the setting of obesity.  WBC's seem to be improving as she is losing weight with intermittent fasting. - PLAN: Myeloproliferative disorder has been ruled out.  Suspect reactive leukocytosis.  No further hematology work-up is needed.  We will discharge patient from clinic.  She   should continue to follow with her  PCP with CBC checked at least annually.  She  can be referred back to [place] in the future if WBC persistently >20,000 (in the absence of acute infection) or if he develops unexplained B symptoms in the presence of any amount of leukocytosis.  PLAN SUMMARY: >> Tentative discharge from clinic.  Can follow-up as needed.     REVIEW OF SYSTEMS:   Review of Systems  Constitutional:  Positive for fatigue (mild). Negative for appetite change, chills, diaphoresis, fever and unexpected weight change.  HENT:   Negative for lump/mass and nosebleeds.   Eyes:  Negative for eye problems.  Respiratory:  Negative for cough, hemoptysis and shortness of breath.   Cardiovascular:  Negative for chest pain, leg swelling and palpitations.  Gastrointestinal:  Negative for abdominal pain, blood in stool, constipation, diarrhea, nausea and vomiting.  Genitourinary:  Positive for dysuria (current UTI). Negative for hematuria.   Skin: Negative.   Neurological:  Negative for dizziness, headaches and light-headedness.  Hematological:  Does not bruise/bleed easily.     PHYSICAL EXAM:  ECOG PERFORMANCE STATUS: 0 - Asymptomatic  There were no vitals filed for this visit. There were no vitals filed  for this visit. Physical Exam Constitutional:      Appearance: Normal appearance. She is obese.  HENT:     Head: Normocephalic and atraumatic.     Mouth/Throat:     Mouth: Mucous membranes are moist.  Eyes:     Extraocular Movements:  Extraocular movements intact.     Pupils: Pupils are equal, round, and reactive to light.  Cardiovascular:     Rate and Rhythm: Normal rate and regular rhythm.     Pulses: Normal pulses.     Heart sounds: Normal heart sounds.  Pulmonary:     Effort: Pulmonary effort is normal.     Breath sounds: Normal breath sounds.  Abdominal:     General: Bowel sounds are normal.     Palpations: Abdomen is soft.     Tenderness: There is no abdominal tenderness.  Musculoskeletal:        General: No swelling.     Right lower leg: No edema.     Left lower leg: No edema.  Lymphadenopathy:     Cervical: No cervical adenopathy.  Skin:    General: Skin is warm and dry.  Neurological:     General: No focal deficit present.     Mental Status: She is alert and oriented to person, place, and time.  Psychiatric:        Mood and Affect: Mood normal.        Behavior: Behavior normal.     PAST MEDICAL/SURGICAL HISTORY:  Past Medical History:  Diagnosis Date   Allergy    Past Surgical History:  Procedure Laterality Date   BACK SURGERY  05/2019   CESAREAN SECTION  05/2006   TUBAL LIGATION  2007   uterine ablation  2008   VAGINAL DELIVERY  1995    SOCIAL HISTORY:  Social History   Socioeconomic History   Marital status: Married    Spouse name: Derrick   Number of children: 2   Years of education: Not on file   Highest education level: Not on file  Occupational History   Not on file  Tobacco Use   Smoking status: Former    Packs/day: 1.50    Years: 14.00    Total pack years: 21.00    Types: Cigarettes    Quit date: 01/15/2006    Years since quitting: 16.9   Smokeless tobacco: Never  Vaping Use   Vaping Use: Never used  Substance and Sexual Activity   Alcohol use: Never   Drug use: Never   Sexual activity: Never  Other Topics Concern   Not on file  Social History Narrative   Not on file   Social Determinants of Health   Financial Resource Strain: Not on file  Food  Insecurity: Not on file  Transportation Needs: Not on file  Physical Activity: Not on file  Stress: Not on file  Social Connections: Not on file  Intimate Partner Violence: Not on file    FAMILY HISTORY:  Family History  Problem Relation Age of Onset   Stroke Father    Diabetes Father     CURRENT MEDICATIONS:  Outpatient Encounter Medications as of 12/23/2022  Medication Sig   Cholecalciferol (VITAMIN D3) 1.25 MG (50000 UT) CAPS Take 50,000 Units by mouth once a week.   docusate sodium (COLACE) 100 MG capsule Take 1 capsule (100 mg total) by mouth 2 (two) times daily.   ibuprofen (ADVIL) 200 MG tablet Take 400 mg by mouth every 6 (six) hours as needed for moderate  pain.   ondansetron (ZOFRAN-ODT) 4 MG disintegrating tablet 4mg  ODT q4 hours prn nausea/vomit   oxyCODONE (ROXICODONE) 5 MG immediate release tablet Take 1 tablet (5 mg total) by mouth every 6 (six) hours as needed.   No facility-administered encounter medications on file as of 12/23/2022.    ALLERGIES:  No Known Allergies  LABORATORY DATA:  I have reviewed the labs as listed.  CBC    Component Value Date/Time   WBC 13.2 (H) 09/29/2022 1943   RBC 4.07 09/29/2022 1943   HGB 11.3 (L) 09/29/2022 1943   HCT 34.4 (L) 09/29/2022 1943   PLT 321 09/29/2022 1943   MCV 84.5 09/29/2022 1943   MCH 27.8 09/29/2022 1943   MCHC 32.8 09/29/2022 1943   RDW 14.4 09/29/2022 1943   LYMPHSABS 2.0 06/17/2022 0803   MONOABS 0.7 06/17/2022 0803   EOSABS 0.1 06/17/2022 0803   BASOSABS 0.1 06/17/2022 0803      Latest Ref Rng & Units 09/29/2022    7:43 PM  CMP  Glucose 70 - 99 mg/dL 109   BUN 6 - 20 mg/dL 18   Creatinine 0.44 - 1.00 mg/dL 0.72   Sodium 135 - 145 mmol/L 137   Potassium 3.5 - 5.1 mmol/L 3.5   Chloride 98 - 111 mmol/L 108   CO2 22 - 32 mmol/L 22   Calcium 8.9 - 10.3 mg/dL 8.8   Total Protein 6.5 - 8.1 g/dL 7.3   Total Bilirubin 0.3 - 1.2 mg/dL 0.4   Alkaline Phos 38 - 126 U/L 69   AST 15 - 41 U/L 50   ALT 0  - 44 U/L 24     DIAGNOSTIC IMAGING:  I have independently reviewed the relevant imaging and discussed with the patient.   WRAP UP:  All questions were answered. The patient knows to call the clinic with any problems, questions or concerns.  Medical decision making: Low  Time spent on visit: I spent 15 minutes counseling the patient face to face. The total time spent in the appointment was 22 minutes and more than 50% was on counseling.  Harriett Rush, PA-C  12/23/22 9:05 AM

## 2022-12-23 ENCOUNTER — Inpatient Hospital Stay: Payer: Commercial Managed Care - PPO

## 2022-12-23 ENCOUNTER — Ambulatory Visit (HOSPITAL_COMMUNITY)
Admission: RE | Admit: 2022-12-23 | Discharge: 2022-12-23 | Disposition: A | Discharge status: Home / Self Care | Payer: Commercial Managed Care - PPO | Source: Ambulatory Visit | Attending: Internal Medicine | Admitting: Internal Medicine

## 2022-12-23 ENCOUNTER — Ambulatory Visit (HOSPITAL_COMMUNITY): Admission: RE | Admit: 2022-12-23 | Payer: Commercial Managed Care - PPO | Source: Ambulatory Visit

## 2022-12-23 ENCOUNTER — Encounter: Payer: Self-pay | Admitting: Physician Assistant

## 2022-12-23 ENCOUNTER — Inpatient Hospital Stay: Payer: Commercial Managed Care - PPO | Attending: Physician Assistant | Admitting: Physician Assistant

## 2022-12-23 VITALS — BP 125/68 | HR 71 | Resp 18 | Wt 190.7 lb

## 2022-12-23 DIAGNOSIS — Z87891 Personal history of nicotine dependence: Secondary | ICD-10-CM | POA: Diagnosis not present

## 2022-12-23 DIAGNOSIS — Z1231 Encounter for screening mammogram for malignant neoplasm of breast: Secondary | ICD-10-CM | POA: Diagnosis not present

## 2022-12-23 DIAGNOSIS — D72829 Elevated white blood cell count, unspecified: Secondary | ICD-10-CM

## 2022-12-23 LAB — CBC WITH DIFFERENTIAL/PLATELET
Abs Immature Granulocytes: 0.06 10*3/uL (ref 0.00–0.07)
Basophils Absolute: 0 10*3/uL (ref 0.0–0.1)
Basophils Relative: 0 %
Eosinophils Absolute: 0.1 10*3/uL (ref 0.0–0.5)
Eosinophils Relative: 1 %
HCT: 36.2 % (ref 36.0–46.0)
Hemoglobin: 11.8 g/dL — ABNORMAL LOW (ref 12.0–15.0)
Immature Granulocytes: 1 %
Lymphocytes Relative: 20 %
Lymphs Abs: 2.1 10*3/uL (ref 0.7–4.0)
MCH: 26.5 pg (ref 26.0–34.0)
MCHC: 32.6 g/dL (ref 30.0–36.0)
MCV: 81.3 fL (ref 80.0–100.0)
Monocytes Absolute: 0.7 10*3/uL (ref 0.1–1.0)
Monocytes Relative: 7 %
Neutro Abs: 7.3 10*3/uL (ref 1.7–7.7)
Neutrophils Relative %: 71 %
Platelets: 313 10*3/uL (ref 150–400)
RBC: 4.45 MIL/uL (ref 3.87–5.11)
RDW: 16.8 % — ABNORMAL HIGH (ref 11.5–15.5)
WBC: 10.3 10*3/uL (ref 4.0–10.5)
nRBC: 0 % (ref 0.0–0.2)

## 2022-12-23 LAB — LACTATE DEHYDROGENASE: LDH: 98 U/L (ref 98–192)

## 2022-12-23 NOTE — Patient Instructions (Signed)
[place] [facility] **   You were seen today by Tarri Abernethy PA-C for your high white blood cells.    Based on the labs that we checked, your white blood cell count does not show any signs of blood cancer.  You do not need any further testing or appointments at the hematology clinic [facility]).  However, you should continue to follow-up with your primary care doctor and should have your blood counts checked at least once per year.  Your primary care doctor can refer you back to see [place] in the future if needed if you have persistently elevated white blood cells > 20 (other than during active infections), or if you have any elevation in blood cells with unexplained fever, chills, night sweats, or weight loss.   ** Thank you for trusting me with your healthcare!  I strive to provide all of my patients with quality care at each visit.  If you receive a survey for this visit, I would be so grateful to you for taking the time to provide feedback.  Thank you in advance!  ~ Lekita Kerekes                   Dr. Derek Jack   &   Tarri Abernethy, PA-C   - - - - - - - - - - - - - - - - - -    Thank you for choosing [place] at [facility] to provide your oncology and hematology care.  To afford each patient quality time with our provider, please arrive at least 15 minutes before your scheduled appointment time.   If you have a lab appointment with the [facility] please come in thru the Main Entrance and check in at the main information desk.  You need to re-schedule your appointment should you arrive 10 or more minutes late.  We strive to give you quality time with our providers, and arriving late affects you and other patients whose appointments are after yours.  Also, if you no show three or more times for appointments you may be dismissed from the clinic at the providers discretion.      Again, thank you for choosing [facility].  Our hope is that these requests will decrease the amount of time that you wait before being seen by our physicians.       _____________________________________________________________  Should you have questions after your visit to [facility], please contact our office at [phone] and follow the prompts.  Our office hours are 8:00 a.m. and 4:30 p.m. Monday - Friday.  Please note that voicemails left after 4:00 p.m. may not be returned until the following business day.  We are closed weekends and major holidays.  You do have access to a nurse 24-7, just call the main number to the clinic [phone] and do not press any options, hold on the line and a nurse will answer the phone.    For prescription refill requests, have your pharmacy contact our office and allow 72 hours.

## 2023-03-23 ENCOUNTER — Other Ambulatory Visit (HOSPITAL_COMMUNITY): Payer: Self-pay | Admitting: Obstetrics and Gynecology

## 2023-03-23 DIAGNOSIS — R1031 Right lower quadrant pain: Secondary | ICD-10-CM

## 2023-04-02 ENCOUNTER — Ambulatory Visit (HOSPITAL_COMMUNITY)
Admission: RE | Admit: 2023-04-02 | Discharge: 2023-04-02 | Disposition: A | Discharge status: Home / Self Care | Payer: Commercial Managed Care - PPO | Source: Ambulatory Visit | Attending: Obstetrics and Gynecology | Admitting: Obstetrics and Gynecology

## 2023-04-02 DIAGNOSIS — G8929 Other chronic pain: Secondary | ICD-10-CM | POA: Diagnosis present

## 2023-04-02 DIAGNOSIS — R1031 Right lower quadrant pain: Secondary | ICD-10-CM | POA: Diagnosis present

## 2023-05-17 ENCOUNTER — Ambulatory Visit (INDEPENDENT_AMBULATORY_CARE_PROVIDER_SITE_OTHER): Payer: Commercial Managed Care - PPO | Admitting: Urology

## 2023-05-17 ENCOUNTER — Encounter: Payer: Self-pay | Admitting: Urology

## 2023-05-17 VITALS — BP 142/80 | HR 61

## 2023-05-17 DIAGNOSIS — Z87440 Personal history of urinary (tract) infections: Secondary | ICD-10-CM | POA: Diagnosis not present

## 2023-05-17 DIAGNOSIS — R310 Gross hematuria: Secondary | ICD-10-CM | POA: Diagnosis not present

## 2023-05-17 DIAGNOSIS — N390 Urinary tract infection, site not specified: Secondary | ICD-10-CM

## 2023-05-17 LAB — URINALYSIS, ROUTINE W REFLEX MICROSCOPIC
Bilirubin, UA: NEGATIVE
Glucose, UA: NEGATIVE
Ketones, UA: NEGATIVE
Leukocytes,UA: NEGATIVE
Nitrite, UA: NEGATIVE
Protein,UA: NEGATIVE
RBC, UA: NEGATIVE
Specific Gravity, UA: <1.005 — ABNORMAL LOW (ref 1.005–1.030)
Urobilinogen, Ur: 0.2 mg/dL (ref 0.2–1.0)
pH, UA: 6.5 (ref 5.0–7.5)

## 2023-05-17 LAB — BLADDER SCAN AMB NON-IMAGING: Scan Result: 128

## 2023-05-17 NOTE — Progress Notes (Signed)
05/17/2023 1:27 PM   Sarah Huff 27-Dec-1975 [id number]  Referring provider: Benita Stabile, MD [address] [place],  [place] [address]  No chief complaint on file.   HPI:  1) UTI - in Jan 2024 she developed dysuria and frequency. Urgency. One cx grew GBS. Abx helped but it recurred in Mar and May. Now on abx for sinus. Still has periods. No GU or pelvic surgery apart from c-section, BTL. No NG risk. No constipation.   2) gross hematuria - She noted gross hematuria - red urine - with the May episode. She quit smoking 17 yrs ago and smoked x 15 yrs.   3) kidney stone - passed a stone in 2018. No surgery for stones.   Today, seen for the above.   She works for [employer]. She does intermittent fasting.  PMH: Past Medical History:  Diagnosis Date   Allergy     Surgical History: Past Surgical History:  Procedure Laterality Date   BACK SURGERY  05/2019   CESAREAN SECTION  05/2006   TUBAL LIGATION  2007   uterine ablation  2008   VAGINAL DELIVERY  1995    Home Medications:  Allergies as of 05/17/2023   No Known Allergies      Medication List        Accurate as of May 17, 2023  1:27 PM. If you have any questions, ask your nurse or doctor.          amoxicillin 500 MG capsule Commonly known as: AMOXIL Take 500 mg by mouth every 12 (twelve) hours.   naproxen sodium 550 MG tablet Commonly known as: ANAPROX Take 1 tablet by mouth daily as needed for mild pain.   nitrofurantoin (macrocrystal-monohydrate) 100 MG capsule Commonly known as: MACROBID Take 100 mg by mouth every 12 (twelve) hours.   Vitamin D3 1.25 MG (50000 UT) Caps Take 50,000 Units by mouth once a week.        Allergies: No Known Allergies  Family History: Family History  Problem Relation Age of Onset   Stroke Father    Diabetes Father     Social History:  reports that she quit smoking about 17 years ago. Her smoking use included cigarettes. She has a 21.00 pack-year  smoking history. She has never used smokeless tobacco. She reports that she does not drink alcohol and does not use drugs.   Physical Exam: BP (!) 142/80   Pulse 61   Constitutional:  Alert and oriented, No acute distress. HEENT: [place] AT, moist mucus membranes.  Trachea midline, no masses. Cardiovascular: No clubbing, cyanosis, or edema. Respiratory: Normal respiratory effort, no increased work of breathing. GI: Abdomen is soft, nontender, nondistended, no abdominal masses GU: No CVA tenderness Skin: No rashes, bruises or suspicious lesions. Neurologic: Grossly intact, no focal deficits, moving all 4 extremities. Psychiatric: Normal mood and affect.  Laboratory Data: Lab Results  Component Value Date   WBC 10.3 12/23/2022   HGB 11.8 (L) 12/23/2022   HCT 36.2 12/23/2022   MCV 81.3 12/23/2022   PLT 313 12/23/2022    Lab Results  Component Value Date   CREATININE 0.72 09/29/2022    No results found for: "PSA"  No results found for: "TESTOSTERONE"  No results found for: "HGBA1C"  Urinalysis No results found for: "COLORURINE", "APPEARANCEUR", "LABSPEC", "PHURINE", "GLUCOSEU", "HGBUR", "BILIRUBINUR", "KETONESUR", "PROTEINUR", "UROBILINOGEN", "NITRITE", "LEUKOCYTESUR"  No results found for: "LABMICR", "WBCUA", "RBCUA", "LABEPIT", "MUCUS", "BACTERIA"  Pertinent Imaging: N/a   Assessment &  Plan:    1. Recurrent UTI Discussed common to get a UTI p abx. Cont d-mannose.   2. Gross hematuria - disc nature r/b/a to cystoscopy and CT scan.   - Urinalysis, Routine w reflex microscopic - BLADDER SCAN AMB NON-IMAGING   No follow-ups on file.  Jerilee Field, MD  [facility]  [address] [place], [place] [address] [phone]

## 2023-05-25 ENCOUNTER — Ambulatory Visit
Admission: RE | Admit: 2023-05-25 | Discharge: 2023-05-25 | Disposition: A | Discharge status: Home / Self Care | Payer: Commercial Managed Care - PPO | Source: Ambulatory Visit | Attending: Nurse Practitioner | Admitting: Nurse Practitioner

## 2023-05-25 VITALS — BP 135/83 | HR 60 | Temp 98.0°F | Resp 18

## 2023-05-25 DIAGNOSIS — S61214A Laceration without foreign body of right ring finger without damage to nail, initial encounter: Secondary | ICD-10-CM | POA: Diagnosis not present

## 2023-05-25 DIAGNOSIS — Z23 Encounter for immunization: Secondary | ICD-10-CM

## 2023-05-25 MED ORDER — TETANUS-DIPHTH-ACELL PERTUSSIS 5-2.5-18.5 LF-MCG/0.5 IM SUSY
0.5000 mL | PREFILLED_SYRINGE | Freq: Once | INTRAMUSCULAR | Status: AC
  Administered 2023-05-25: 0.5 mL via INTRAMUSCULAR

## 2023-05-25 NOTE — ED Triage Notes (Signed)
Pt reports she sliced the tip of her right ring finger off on a Mandelin x 1 day, Pt states she was bleeding a lot.    Last tetanus unknown. Thinks it is more than 10+ years

## 2023-05-25 NOTE — ED Provider Notes (Signed)
[facility]    CSN: [id number] Arrival date & time: 05/25/23  1248      History   Chief Complaint Chief Complaint  Patient presents with   Laceration    HPI Sarah Huff is a 47 y.o. female.   Patient presents today with laceration to right ring finger that she sustained last night while cutting a cucumber with a mandolin.  Reports initially, it bled a lot, however she was able to control the bleeding last night.  Reports today when she woke up, she took the initial bandage off and noted more bleeding, however is able to control it with pressure.  Has been keeping the area clean and dry.  No numbness or tingling in the fingertips, fever, nausea/vomiting.  Patient has full range of motion of the digit.  Last tetanus shot more than 10 years ago.    Past Medical History:  Diagnosis Date   Allergy     Patient Active Problem List   Diagnosis Date Noted   Calculus of gallbladder without cholecystitis without obstruction 10/29/2022   Leukocytosis 01/16/2020    Past Surgical History:  Procedure Laterality Date   BACK SURGERY  05/2019   CESAREAN SECTION  05/2006   TUBAL LIGATION  2007   uterine ablation  2008   VAGINAL DELIVERY  1995    OB History   No obstetric history on file.      Home Medications    Prior to Admission medications   Medication Sig Start Date End Date Taking? Authorizing Provider  amoxicillin (AMOXIL) 500 MG capsule Take 500 mg by mouth every 12 (twelve) hours. 12/28/22   [provider]  Cholecalciferol (VITAMIN D3) 1.25 MG (50000 UT) CAPS Take 50,000 Units by mouth once a week. 10/14/21   [provider]  naproxen sodium (ANAPROX) 550 MG tablet Take 1 tablet by mouth daily as needed for mild pain.    [provider]  nitrofurantoin, macrocrystal-monohydrate, (MACROBID) 100 MG capsule Take 100 mg by mouth every 12 (twelve) hours. Patient not taking: Reported on 05/17/2023 12/18/22   [provider]     Family History Family History  Problem Relation Age of Onset   Stroke Father    Diabetes Father     Social History Social History   Tobacco Use   Smoking status: Former    Packs/day: 1.50    Years: 14.00    Additional pack years: 0.00    Total pack years: 21.00    Types: Cigarettes    Quit date: 01/15/2006    Years since quitting: 17.3   Smokeless tobacco: Never  Vaping Use   Vaping Use: Never used  Substance Use Topics   Alcohol use: Never   Drug use: Never     Allergies   Patient has no known allergies.   Review of Systems Review of Systems Per HPI  Physical Exam Triage Vital Signs ED Triage Vitals [05/25/23 1305]  Enc Vitals Group     BP 135/83     Pulse Rate 60     Resp 18     Temp 98 F (36.7 C)     Temp Source Oral     SpO2 98 %     Weight      Height      Head Circumference      Peak Flow      Pain Score 5     Pain Loc      Pain Edu?  Excl. in GC?    No data found.  Updated Vital Signs BP 135/83 (BP Location: Right Arm)   Pulse 60   Temp 98 F (36.7 C) (Oral)   Resp 18   LMP 05/05/2023 (Approximate)   SpO2 98%   Visual Acuity Right Eye Distance:   Left Eye Distance:   Bilateral Distance:    Right Eye Near:   Left Eye Near:    Bilateral Near:     Physical Exam Vitals and nursing note reviewed.  Constitutional:      General: She is not in acute distress.    Appearance: Normal appearance. She is not toxic-appearing.  HENT:     Mouth/Throat:     Mouth: Mucous membranes are moist.     Pharynx: Oropharynx is clear.  Pulmonary:     Effort: Pulmonary effort is normal. No respiratory distress.  Skin:    General: Skin is warm and dry.     Capillary Refill: Capillary refill takes less than 2 seconds.     Findings: Laceration present.     Comments: Avulsed laceration to tip of right fourth digit approximately 1 cm in length, 0.25 cm in width.  Digit is neurovascularly intact, with full range of motion.  Neurological:      Mental Status: She is alert and oriented to person, place, and time.  Psychiatric:        Behavior: Behavior is cooperative.      UC Treatments / Results  Labs (all labs ordered are listed, but only abnormal results are displayed) Labs Reviewed - No data to display  EKG   Radiology No results found.  Procedures Procedures (including critical care time)  Medications Ordered in UC Medications  Tdap (BOOSTRIX) injection 0.5 mL (has no administration in time range)    Initial Impression / Assessment and Plan / UC Course  I have reviewed the triage vital signs and the nursing notes.  Pertinent labs & imaging results that were available during my care of the patient were reviewed by me and considered in my medical decision making (see chart for details).   Patient is well-appearing, normotensive, afebrile, not tachycardic, not tachypneic, oxygenating well on room air.    1. Laceration of right ring finger without foreign body without damage to nail, initial encounter Wound closure not indicated today Wound care discussed including Vaseline gauze, cleaning twice daily with mild soap and water Tdap updated Signs and symptoms of infection discussed and when to return to urgent care discussed  The patient was given the opportunity to ask questions.  All questions answered to their satisfaction.  The patient is in agreement to this plan.    Final Clinical Impressions(s) / UC Diagnoses   Final diagnoses:  Laceration of right ring finger without foreign body without damage to nail, initial encounter     Discharge Instructions      Please continue to keep the wound on your fingertip clean and dry.  Clean it twice daily with mild soap and water, do not use any peroxide or alcohol.  Thereafter, apply a thin layer of a small amount of Vaseline gauze and cover with gauze and secure with tape.  Continue this process until scab has developed.  Then, you can leave the wound open to air.   We have updated your tetanus shot today.    ED Prescriptions   None    PDMP not reviewed this encounter.   Valentino Nose, NP 05/25/23 1320

## 2023-05-25 NOTE — Discharge Instructions (Signed)
Please continue to keep the wound on your fingertip clean and dry.  Clean it twice daily with mild soap and water, do not use any peroxide or alcohol.  Thereafter, apply a thin layer of a small amount of Vaseline gauze and cover with gauze and secure with tape.  Continue this process until scab has developed.  Then, you can leave the wound open to air.  We have updated your tetanus shot today.

## 2023-05-25 NOTE — ED Notes (Signed)
Pt noted to be soaking finger in hibiclense and water mixture prior to dressing being applied. Per NP, apply Vaseline gauze to site, place dry gauze over top and secure with coban. Pt tolerated well. Site management and infection prevention reviewed. Pt verbalized understanding.

## 2023-05-26 ENCOUNTER — Ambulatory Visit (HOSPITAL_BASED_OUTPATIENT_CLINIC_OR_DEPARTMENT_OTHER)
Admission: RE | Admit: 2023-05-26 | Discharge: 2023-05-26 | Disposition: A | Discharge status: Home / Self Care | Payer: Commercial Managed Care - PPO | Source: Ambulatory Visit | Attending: Urology | Admitting: Urology

## 2023-05-26 ENCOUNTER — Encounter (HOSPITAL_BASED_OUTPATIENT_CLINIC_OR_DEPARTMENT_OTHER): Payer: Self-pay

## 2023-05-26 DIAGNOSIS — R310 Gross hematuria: Secondary | ICD-10-CM | POA: Insufficient documentation

## 2023-05-26 MED ORDER — IOHEXOL 350 MG/ML SOLN
100.0000 mL | Freq: Once | INTRAVENOUS | Status: AC | PRN
  Administered 2023-05-26: 100 mL via INTRAVENOUS

## 2023-06-14 ENCOUNTER — Ambulatory Visit: Payer: Commercial Managed Care - PPO | Admitting: Urology

## 2023-06-14 ENCOUNTER — Encounter: Payer: Self-pay | Admitting: Urology

## 2023-06-14 VITALS — BP 162/95 | HR 61

## 2023-06-14 DIAGNOSIS — Z87440 Personal history of urinary (tract) infections: Secondary | ICD-10-CM

## 2023-06-14 DIAGNOSIS — R310 Gross hematuria: Secondary | ICD-10-CM

## 2023-06-14 DIAGNOSIS — N390 Urinary tract infection, site not specified: Secondary | ICD-10-CM

## 2023-06-14 DIAGNOSIS — Z87442 Personal history of urinary calculi: Secondary | ICD-10-CM

## 2023-06-14 LAB — URINALYSIS, ROUTINE W REFLEX MICROSCOPIC
Bilirubin, UA: NEGATIVE
Glucose, UA: NEGATIVE
Ketones, UA: NEGATIVE
Leukocytes,UA: NEGATIVE
Nitrite, UA: NEGATIVE
Protein,UA: NEGATIVE
RBC, UA: NEGATIVE
Specific Gravity, UA: <1.005 — ABNORMAL LOW (ref 1.005–1.030)
Urobilinogen, Ur: 0.2 mg/dL (ref 0.2–1.0)
pH, UA: 5.5 (ref 5.0–7.5)

## 2023-06-14 MED ORDER — CIPROFLOXACIN HCL 500 MG PO TABS
500.0000 mg | ORAL_TABLET | Freq: Once | ORAL | Status: AC
Start: 2023-06-14 — End: 2023-06-14
  Administered 2023-06-14: 500 mg via ORAL

## 2023-06-14 NOTE — Progress Notes (Unsigned)
  [place] 06/14/23  CC: No chief complaint on file.   HPI:  F/u -  1) UTI - in Jan 2024 she developed dysuria and frequency. Urgency. One cx grew GBS. Abx helped but it recurred in Mar and May. Now on abx for sinus. Still has periods. No GU or pelvic surgery apart from c-section, BTL. No NG risk. No constipation.    2) gross hematuria - She noted gross hematuria - red urine - with the May episode. She quit smoking 17 yrs ago and smoked x 15 yrs.    3) kidney stone - passed a stone in 2018. No surgery for stones.    Today, seen for the above. Jun 2024 CT a/p was benign. I reviewed the images. UA today clear.    She works for [employer]. She does intermittent fasting. Blood pressure (!) 162/95, pulse 61, last menstrual period 05/05/2023. NED. A&Ox3.   No respiratory distress   Abd soft, NT, ND Normal external genitalia with patent urethral meatus Tampon in place.  No prolapse  Chaperone: [id number] - for exam and cystoscopy   Cystoscopy Procedure Note  Patient identification was confirmed, informed consent was obtained, and patient was prepped using Betadine solution.  Lidocaine jelly was administered per urethral meatus.    Procedure: - Flexible cystoscope introduced, without any difficulty.   - Thorough search of the bladder revealed:    normal urethral meatus    normal urothelium    no stones    no ulcers     no tumors    no urethral polyps    no trabeculation  - Ureteral orifices were normal in position and appearance.  Post-Procedure: - Patient tolerated the procedure well  Assessment/ Plan:  Gross hematuria - benign eval. Drink plenty of water. See in 1 year or sooner if issues.    No follow-ups on file.  Jerilee Field, MD

## 2023-12-20 ENCOUNTER — Other Ambulatory Visit (HOSPITAL_COMMUNITY): Payer: Self-pay | Admitting: Internal Medicine

## 2023-12-20 ENCOUNTER — Other Ambulatory Visit (HOSPITAL_COMMUNITY): Payer: Self-pay | Admitting: Family Medicine

## 2023-12-20 DIAGNOSIS — Z1231 Encounter for screening mammogram for malignant neoplasm of breast: Secondary | ICD-10-CM

## 2023-12-27 ENCOUNTER — Inpatient Hospital Stay (HOSPITAL_COMMUNITY): Admission: RE | Admit: 2023-12-27 | Payer: Commercial Managed Care - PPO | Source: Ambulatory Visit

## 2023-12-27 DIAGNOSIS — Z1231 Encounter for screening mammogram for malignant neoplasm of breast: Secondary | ICD-10-CM

## 2024-01-10 ENCOUNTER — Other Ambulatory Visit (HOSPITAL_COMMUNITY): Payer: Self-pay | Admitting: Family Medicine

## 2024-01-10 DIAGNOSIS — Z1231 Encounter for screening mammogram for malignant neoplasm of breast: Secondary | ICD-10-CM

## 2024-01-12 ENCOUNTER — Ambulatory Visit (HOSPITAL_COMMUNITY)
Admission: RE | Admit: 2024-01-12 | Discharge: 2024-01-12 | Disposition: A | Discharge status: Home / Self Care | Payer: Commercial Managed Care - PPO | Source: Ambulatory Visit | Attending: Obstetrics and Gynecology | Admitting: Obstetrics and Gynecology

## 2024-01-12 DIAGNOSIS — Z1231 Encounter for screening mammogram for malignant neoplasm of breast: Secondary | ICD-10-CM | POA: Diagnosis present

## 2024-08-23 ENCOUNTER — Other Ambulatory Visit: Payer: Self-pay | Admitting: Obstetrics and Gynecology

## 2024-08-25 LAB — SURGICAL PATHOLOGY

## 2025-01-12 ENCOUNTER — Encounter (HOSPITAL_COMMUNITY)

## 2025-01-12 DIAGNOSIS — Z1231 Encounter for screening mammogram for malignant neoplasm of breast: Secondary | ICD-10-CM
